# Patient Record
Sex: Female | Born: 1950 | Hispanic: Yes | Smoking: Never smoker
Health system: Southern US, Community
[De-identification: ages and names within clinical notes are randomized; demographics above are authoritative.]

## PROBLEM LIST (undated history)

## (undated) DIAGNOSIS — Z87442 Personal history of urinary calculi: Secondary | ICD-10-CM

## (undated) DIAGNOSIS — N200 Calculus of kidney: Secondary | ICD-10-CM

## (undated) DIAGNOSIS — K219 Gastro-esophageal reflux disease without esophagitis: Secondary | ICD-10-CM

## (undated) HISTORY — DX: Calculus of kidney: N20.0

## (undated) HISTORY — PX: CYST EXCISION: SHX5701

---

## 2005-07-24 ENCOUNTER — Ambulatory Visit: Payer: Self-pay

## 2007-06-05 ENCOUNTER — Emergency Department: Payer: Self-pay | Admitting: Emergency Medicine

## 2008-01-15 ENCOUNTER — Ambulatory Visit: Payer: Self-pay

## 2011-06-13 ENCOUNTER — Ambulatory Visit: Payer: Self-pay

## 2013-07-02 ENCOUNTER — Inpatient Hospital Stay: Payer: Self-pay | Admitting: Internal Medicine

## 2013-07-02 LAB — COMPREHENSIVE METABOLIC PANEL
Albumin: 3.9 g/dL (ref 3.4–5.0)
Alkaline Phosphatase: 122 U/L (ref 50–136)
Anion Gap: 4 — ABNORMAL LOW (ref 7–16)
Bilirubin,Total: 0.2 mg/dL (ref 0.2–1.0)
Calcium, Total: 9.3 mg/dL (ref 8.5–10.1)
Chloride: 107 mmol/L (ref 98–107)
Co2: 28 mmol/L (ref 21–32)
Creatinine: 0.58 mg/dL — ABNORMAL LOW (ref 0.60–1.30)
EGFR (African American): >60
EGFR (Non-African Amer.): >60
Glucose: 108 mg/dL — ABNORMAL HIGH (ref 65–99)
Osmolality: 279 (ref 275–301)
Potassium: 3.6 mmol/L (ref 3.5–5.1)
SGOT(AST): 32 U/L (ref 15–37)
Sodium: 139 mmol/L (ref 136–145)
Total Protein: 7.9 g/dL (ref 6.4–8.2)

## 2013-07-02 LAB — URINALYSIS, COMPLETE
Bilirubin,UR: NEGATIVE
Nitrite: NEGATIVE
Ph: 7 (ref 4.5–8.0)
Protein: NEGATIVE
RBC,UR: <1 /HPF (ref 0–5)
Specific Gravity: 1.01 (ref 1.003–1.030)
Squamous Epithelial: 1
WBC UR: <1 /HPF (ref 0–5)

## 2013-07-02 LAB — CBC
HGB: 13.6 g/dL (ref 12.0–16.0)
MCH: 29.2 pg (ref 26.0–34.0)
Platelet: 190 10*3/uL (ref 150–440)
RBC: 4.68 10*6/uL (ref 3.80–5.20)
RDW: 13.9 % (ref 11.5–14.5)

## 2013-07-02 LAB — TROPONIN I: Troponin-I: <0.02 ng/mL

## 2013-07-03 LAB — LIPID PANEL
HDL Cholesterol: 29 mg/dL — ABNORMAL LOW (ref 40–60)
Triglycerides: 377 mg/dL — ABNORMAL HIGH (ref 0–200)
VLDL Cholesterol, Calc: 75 mg/dL — ABNORMAL HIGH (ref 5–40)

## 2013-07-03 LAB — BASIC METABOLIC PANEL
BUN: 15 mg/dL (ref 7–18)
Calcium, Total: 8.9 mg/dL (ref 8.5–10.1)
Creatinine: 0.65 mg/dL (ref 0.60–1.30)
EGFR (African American): >60
EGFR (Non-African Amer.): >60
Potassium: 3.8 mmol/L (ref 3.5–5.1)

## 2013-10-22 ENCOUNTER — Ambulatory Visit: Payer: Self-pay | Admitting: Family Medicine

## 2014-12-10 NOTE — Discharge Summary (Signed)
PATIENT NAME:  Krystal Lewis, Krystal Lewis MR#:  [id number] DATE OF BIRTH:  1951/08/09  DATE OF ADMISSION:  07/02/2013 DATE OF DISCHARGE:  07/03/2013  DISCHARGE DIAGNOSES: 1.  Transient ischemic attack.  2.  Hypertriglyceridemia.  CONSULTANTS: Dr. Manuella Ghazi with neurology.   IMAGING STUDIES DONE: Include a  1.  CT scan of the head which was normal. 2.  MRI of the brain which showed white matter disease consistent with chronic ischemic changes.  3.  Echocardiogram showed ejection fraction of 65%. No thrombus, no valvular abnormalities.  4.  Ultrasound carotid Doppler showed no significant stenosis.   ADMITTING HISTORY AND PHYSICAL AND HOSPITAL COURSE: Please see detailed H and P dictated by Dr. Posey Pronto. In brief, a 64 year old female patient presented to the hospital with left leg, face numbness and some visual difficulties. The patient was admitted for concern for cerebrovascular accident. The patient had a CT scan of the head done, which was normal. The patient's symptoms had resolved by the time of discharge. MRI of the brain showed only some white matter disease with small vessel ischemia, no acute stroke. The patient was seen by Dr. Manuella Ghazi with neurology, who suggested discharging the patient and follow up as outpatient. The patient has been started on gemfibrozil for her hypertriglyceridemia, along with a baby aspirin every day. The patient is symptom-free, has ambulated well with physical therapy, does not have any rehab needs.   Prior to discharge, the patient's motor strength is 5/5 in upper and lower extremities. Sensation is intact all over.   DISCHARGE MEDICATIONS: Include:  1.  Aspirin 81 mg daily.  2.  Gemfibrozil 600 mg b.i.d.   DISCHARGE INSTRUCTIONS: Low-fat diet. Activity as tolerated. Follow up with Dr. Manuella Ghazi of neurology in 1 to 2 weeks.   TIME SPENT ON DAY OF DISCHARGE IN DISCHARGE ACTIVITY: 45 minutes.    ____________________________ Leia Alf Ceniya Fowers, MD srs:cg D: 07/06/2013 01:49:40  ET T: 07/06/2013 03:01:01 ET JOB#: [id number]  cc: Alveta Heimlich R. Irean Kendricks, MD, <Dictator> Neita Carp MD ELECTRONICALLY SIGNED 07/06/2013 20:48

## 2014-12-10 NOTE — Consult Note (Signed)
PATIENT NAME:  Krystal Lewis, SHIVLEY MR#:  [id number] DATE OF BIRTH:  [birth year]  DATE OF CONSULTATION:  07/03/2013  REFERRING PHYSICIAN:  Dustin Flock, MD, and Hillary Bow, MD CONSULTING PHYSICIAN:  Marlaina Coburn K. Manuella Ghazi, MD  REASON FOR CONSULTATION: Abnormal MRI of the brain with concern for multiple sclerosis.  HISTORY OF PRESENT ILLNESS: Krystal Lewis is a 64 year old female who had an acute onset of numbness and weakness in the left side of her leg. It just felt heavy around November 13 afternoon.  She felt like it was big.   The patient did not have any other difficulties which got better after some time.  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  ALLERGIES: She does not have any known drug allergies.  MEDICATIONS: She does not take any medications on a regular basis.  SOCIAL HISTORY: She does not smoke, does not drink alcohol. Does not do drugs.   FAMILY HISTORY: Significant that her father has a heart valve replaced.   REVIEW OF SYSTEMS: A 10-system review of systems was asked and was found to be negative except a recent history of left leg heaviness and feeling that it was big.   PHYSICAL EXAMINATION:  LUNGS: Clear to auscultation.  HEART: S1, S2, heart sounds. Carotid exam did not reveal any bruit.  ABDOMEN: Soft.  MENTAL STATUS: Alert and oriented, followed 2 step interverted commands. Her attention, concentration, were intact. Her fund of knowledge and language seems to be intact. She does not have any neurological neglect.  NEUROLOGIC: On her cranial nerves, her pupils are equal and nonreactive. Extraocular movements were intact. Her visual fields were full. The patient's sensations were intact. Her hearing was intact. Her facial strength was intact. Her tongue was midline. Her palate was upgoing. Her neck strength and shoulder shrug were 5/5. MOTOR: Normal  tone and strength 5/5.  SENSATIONS: Intact to light touch. Her deep tendon reflexes were 1 to 2+. Her coordination was intact. Her gait was intact. She had a negative Romberg's.  ASSESSMENT AND PLAN:  1.  Transient left leg numbness and weakness which is resolved. 2.  The patient denied any headache associated with it for now. She does not have a known history of migraine. 3.  The patient did have abnormal MRI of the brain showing multiple white matter hyperintensities. A few of them are somewhat periventricular but most of them are subcortical region.  5.  The patient denied any typical history such as history of demyelinating disease such as multiple sclerosis. She denied any optic neuritis or any other previous focal weakness or numbness. She does not have Uhthoff phenomenon or Lhermitte sign. The patient's family does not have any other known autoimmune disease, so I have a low suspicion for this being an autoimmune disease condition such as multiple sclerosis.   The patient should have outpatient followup to look for other etiologies for the white matter disease.  Concerning her vascular risk factors, outpatient should take an aspirin on a regular basis.   It will be okay for this patient to be discharged.  Feel free to contact me with any further questions.   ____________________________ Royetta Crochet. Manuella Ghazi, MD hks:np D: 07/03/2013 16:19:30 ET T: 07/03/2013 17:03:01 ET JOB#: [id number]  cc: Derwin Reddy K. Manuella Ghazi, MD, <Dictator> Royetta Crochet [facility] MD ELECTRONICALLY SIGNED 07/08/2013 11:04

## 2014-12-10 NOTE — H&P (Signed)
PATIENT NAME:  Krystal Lewis, Krystal Lewis MR#:  [id number] DATE OF BIRTH:  04/11/1951  DATE OF ADMISSION:  07/02/2013  PRIMARY CARE PROVIDER: None.   EMERGENCY DEPARTMENT REFERRING PHYSICIAN: Dr. Reita Cliche.   CHIEF COMPLAINT: Left leg weakness, left facial numbness, some visual difficulties.   HISTORY OF PRESENT ILLNESS: The patient is a 64 year old Spanish-speaking female with a history of what sounds like Raynaud's phenomenon of the extremities who has no other medical problems that she is aware, who states that around noontime she developed left leg heaviness and weakness. She states that it felt like her leg was like steel. She was still able to ambulate but had to drag her feet. She also has left lower face numbness. She reports that her vision when she is looking at me I look "big," so I am assuming it is blurred vision. She otherwise reports no similar symptoms like this before. She does not have any difficulty swallowing. No other numbness besides the face. Denies any headaches.   PAST MEDICAL HISTORY: History of what sounds like Raynaud's phenomenon of the extremities.   PAST SURGICAL HISTORY: Left ear surgery a long time ago.   ALLERGIES: None.   HOME MEDICATIONS: None.   SOCIAL HISTORY: Does not smoke. Does not drink. No drugs.   FAMILY HISTORY: Father had a heart valve replaced.   REVIEW OF SYSTEMS:   CONSTITUTIONAL: Denies any fevers. No fatigue. Complains of weakness in the left leg. No pain. No weight loss. No weight gain.  EYES: Sounds like blurred vision. No pain. No redness. No inflammation.  ENT: No tinnitus. No ear pain. No hearing loss. No seasonal or year-round allergies. No epistaxis.  RESPIRATORY: Denies any cough, wheezing, hemoptysis. No COPD.   CARDIOVASCULAR: Denies any chest pain, orthopnea, edema or arrhythmia.  GASTROINTESTINAL: No nausea, vomiting, diarrhea. No abdominal pain. No hematemesis. No melena.  GENITOURINARY: Denies any dysuria, hematuria, renal calculus or  frequency.  ENDOCRINE: Denies any polyuria, nocturia or thyroid problems.  HEMATOLOGIC AND LYMPHATIC: Denies anemia, easy bruisability, bleeding.  SKIN: No acne. No rash. No changes in mole, hair or skin.  MUSCULOSKELETAL: Denies any pain in the neck, back or shoulder.  NEUROLOGIC: Complains of facial numbness. No history of previous CVA, TIA or seizures.  PSYCHIATRIC: No history of anxiety, insomnia or ADD.   PHYSICAL EXAMINATION:  VITAL SIGNS: Temperature 97.9, pulse 70, respirations 18, blood pressure was 192/82 on presentation.  GENERAL: The patient is a well-developed, well-nourished, Spanish female in no acute distress.  HEENT: Head atraumatic, normocephalic. Pupils equally round, reactive to light and accommodation. There is no conjunctival pallor. No scleral icterus. Nasal exam shows no drainage or ulceration. Oropharynx is clear without any exudate.  NECK: Supple without any JVD.  CARDIOVASCULAR: Regular rate and rhythm. No murmurs, rubs, clicks or gallops. PMI is not displaced.  LUNGS: Clear to auscultation bilaterally without any rales, rhonchi, wheezing.  ABDOMEN: Soft, nontender, nondistended. Positive bowel sounds x 4. No hepatosplenomegaly.  EXTREMITIES: No clubbing, cyanosis, or edema.  SKIN: No rash.  LYMPHATICS: No lymph nodes palpable.  VASCULAR: Good DP, PT pulses.  PSYCHIATRIC: Not anxious or depressed.  NEUROLOGIC: She has left lower face diminished sensation. Otherwise, sensation intact everywhere else.   EXTREMITIES: Strength 5 out of 5 in the upper extremities. Left lower extremity strength is diminished. It is 3 to 4 out of 5. Right extremity is 5 out of 5. Reflexes 2+. Babinskis downgoing.   EVALUATIONS: WBC 5.4, hemoglobin 13.6, platelet count 190. Glucose 108, BUN 15, creatinine  0.58, sodium 139, potassium 3.6, chloride 107, CO2 is 28, calcium 9.3.   ASSESSMENT AND PLAN: The patient is a 64 year old Spanish-speaking female, presents with left leg weakness,  facial numbness, some visual blurriness.  1. Left-sided leg weakness, facial numbness, visual difficulties: Likely cerebrovascular accident. CT of the head is negative. Will get an MRI of the brain. Will check carotid Dopplers. The patient will be started on aspirin. Check a fasting lipid panel in the a.m. Will start on atorvastatin. The patient able to swallow. I will ask speech to see and physical therapy to see.  2. Elevated blood pressure: Will allow permissive hypertension in light of likely stroke.  3. Miscellaneous: Will place her on Lovenox for deep vein thrombosis prophylaxis.    TIME SPENT: 55 minutes.   NOTE: Spanish interpreter was used.   ____________________________ Lafonda Mosses Posey Pronto, MD shp:gb D: 07/02/2013 18:19:16 ET T: 07/02/2013 21:23:16 ET JOB#: [id number]  cc: Tyrin Herbers H. Posey Pronto, MD, <Dictator> Alric Seton MD ELECTRONICALLY SIGNED 07/03/2013 16:58

## 2014-12-10 NOTE — Consult Note (Signed)
Brief Consult Note: Diagnosis: transient left leg weakness resolved, abnormal MRI brain.   Patient was seen by consultant.   Consult note dictated.   Comments: - abnormal MRI brain likely reporesents WM microvascular ischemic changes and not MS. - discussed with daughter on the phone. Coniser ASA. - f/up in clinic for further work up for other etiologies of WM FLAIR hyperintensities. - OK to be discharged.  Electronic Signatures: Ray Church (MD)  (Signed [phone] 16:14)  Authored: Brief Consult Note   Last Updated: 14-Nov-14 16:14 by Ray Church (MD)

## 2015-12-14 ENCOUNTER — Emergency Department: Payer: Medicare Other

## 2015-12-14 ENCOUNTER — Encounter: Payer: Self-pay | Admitting: Emergency Medicine

## 2015-12-14 ENCOUNTER — Emergency Department
Admission: EM | Admit: 2015-12-14 | Discharge: 2015-12-14 | Disposition: A | Discharge status: Home / Self Care | Payer: Medicare Other | Attending: Emergency Medicine | Admitting: Emergency Medicine

## 2015-12-14 DIAGNOSIS — F17200 Nicotine dependence, unspecified, uncomplicated: Secondary | ICD-10-CM | POA: Diagnosis not present

## 2015-12-14 DIAGNOSIS — N814 Uterovaginal prolapse, unspecified: Secondary | ICD-10-CM

## 2015-12-14 DIAGNOSIS — N812 Incomplete uterovaginal prolapse: Secondary | ICD-10-CM | POA: Insufficient documentation

## 2015-12-14 DIAGNOSIS — N200 Calculus of kidney: Secondary | ICD-10-CM | POA: Diagnosis not present

## 2015-12-14 DIAGNOSIS — N390 Urinary tract infection, site not specified: Secondary | ICD-10-CM | POA: Insufficient documentation

## 2015-12-14 DIAGNOSIS — R102 Pelvic and perineal pain: Secondary | ICD-10-CM | POA: Diagnosis present

## 2015-12-14 DIAGNOSIS — N132 Hydronephrosis with renal and ureteral calculous obstruction: Secondary | ICD-10-CM | POA: Diagnosis not present

## 2015-12-14 HISTORY — DX: Gastro-esophageal reflux disease without esophagitis: K21.9

## 2015-12-14 LAB — URINALYSIS COMPLETE WITH MICROSCOPIC (ARMC ONLY)
BACTERIA UA: NONE SEEN
BACTERIA UA: NONE SEEN
BILIRUBIN URINE: NEGATIVE
Glucose, UA: NEGATIVE mg/dL
Ketones, ur: NEGATIVE mg/dL
Nitrite: NEGATIVE
PH: 6 (ref 5.0–8.0)
PROTEIN: 100 mg/dL — AB
SQUAMOUS EPITHELIAL / LPF: NONE SEEN
Specific Gravity, Urine: 1.015 (ref 1.005–1.030)
Specific Gravity, Urine: 1.016 (ref 1.005–1.030)

## 2015-12-14 LAB — BASIC METABOLIC PANEL
Anion gap: 6 (ref 5–15)
BUN: 14 mg/dL (ref 6–20)
CHLORIDE: 107 mmol/L (ref 101–111)
CO2: 25 mmol/L (ref 22–32)
CREATININE: 0.61 mg/dL (ref 0.44–1.00)
Calcium: 9.1 mg/dL (ref 8.9–10.3)
GFR calc Af Amer: >60 mL/min (ref >60)
GFR calc non Af Amer: >60 mL/min (ref >60)
Glucose, Bld: 110 mg/dL — ABNORMAL HIGH (ref 65–99)
Potassium: 3.6 mmol/L (ref 3.5–5.1)
SODIUM: 138 mmol/L (ref 135–145)

## 2015-12-14 LAB — CBC
HCT: 40.2 % (ref 35.0–47.0)
HEMOGLOBIN: 13.9 g/dL (ref 12.0–16.0)
MCH: 29.1 pg (ref 26.0–34.0)
MCHC: 34.5 g/dL (ref 32.0–36.0)
MCV: 84.4 fL (ref 80.0–100.0)
Platelets: 173 10*3/uL (ref 150–440)
RBC: 4.77 MIL/uL (ref 3.80–5.20)
RDW: 14 % (ref 11.5–14.5)
WBC: 9.2 10*3/uL (ref 3.6–11.0)

## 2015-12-14 MED ORDER — CIPROFLOXACIN HCL 500 MG PO TABS: 500.0000 mg | ORAL_TABLET | Freq: Two times a day (BID) | ORAL | Status: AC

## 2015-12-14 MED ORDER — CIPROFLOXACIN HCL 500 MG PO TABS
500.0000 mg | ORAL_TABLET | Freq: Once | ORAL | Status: AC
  Administered 2015-12-14: 500 mg via ORAL

## 2015-12-14 NOTE — ED Notes (Signed)
Discharge instructions reviewed with patient. Patient verbalized understanding. Patient ambulated to lobby without difficulty.   

## 2015-12-14 NOTE — Discharge Instructions (Signed)
Clculos renales (Kidney Stones) Los clculos renales (urolitiasis) son masas slidas que se forman en el interior de los riones. El dolor intenso es causado por el movimiento de la piedra a travs del tracto urinario. Cuando la piedra se mueve, el urter hace un espasmo alrededor de la misma. El clculo generalmente se elimina con la [place].  CAUSAS   Un trastorno que hace que ciertas glndulas del cuello produzcan demasiada hormona paratiroidea (hiperparatiroidismo primario).  Una acumulacin de cristales de cido rico, similar a Psychiatric nurse.  Estrechamiento (constriccin) del urter.  Obstruccin en el rin presente al nacer (obstruccin congnita).  Cirugas previas del rin o los urteres.  Numerosas infecciones renales. SNTOMAS   Ganas de vomitar (nuseas).  Devolver la comida (vomitar).  Sangre en la orina (hematuria).  Dolor que generalmente se expande (irradia) hacia la ingle.  Ganas de orinar con frecuencia o de manera urgente. DIAGNSTICO   Historia clnica y examen fsico.  Anlisis de sangre y [place].  Tomografa computada.  En algunos casos se realiza un examen del interior de la vejiga (citoscopa). TRATAMIENTO   Observacin.  Aumentar la ingesta de lquidos.  Litotricia extracorprea con ondas de choque: es un procedimiento no invasivo que [place] ondas de choque para romper los clculos renales.  Ser necesaria la ciruga si tiene dolor muy intenso o la obstruccin persiste. Hay varios procedimientos quirrgicos. La mayora de los procedimientos se realizan con el uso de pequeos instrumentos. Slo es Chartered loss adjuster pequeas incisiones para acomodar estos instrumentos, por lo tanto el tiempo de recuperacin es mnimo. El tamao, la ubicacin y la composicin qumica de los clculos son variables importantes que determinarn la eleccin correcta de tratamiento para su caso. Comunquese con su mdico para comprender mejor su  situacin, de modo que pueda [employer] de lesiones para usted y su rin.  INSTRUCCIONES PARA EL CUIDADO EN EL HOGAR   Beba gran cantidad de lquido para mantener la orina de tono claro o color amarillo plido. Esto ayudar a eliminar las piedras o los fragmentos.  Cuele la orina con el colador que le han provisto. Guarde todas las partculas y piedras para que las vea el profesional que lo asiste. Puede ser tan pequea como un grano de sal. Es muy importante usar el colador cada vez que orine. La recoleccin de piedras permitir al [employer]tronic y Musician que efectivamente ha eliminado una piedra. El anlisis de la piedra con frecuencia permitir identificar qu puede hacer para reducir la incidencia de las recurrencias.  Slo tome medicamentos de venta libre o recetados para Glass blower/designer, Health and safety inspector o bajar la fiebre, segn las indicaciones de su mdico.  Consulting civil engineer a todas las visitas de control como se lo haya indicado el mdico. Esto es importante.  Si se lo indica, hgase radiografas. La ausencia de dolor no siempre significa que las piedras se han eliminado. Puede ser que simplemente hayan dejado de moverse. Si el paso de orina permanece completamente obstruido, puede causar prdida de la funcin renal o simplemente la destruccin del rin. Es su responsabilidad Artist seguimiento y las radiografas. Las ecografas del rin pueden mostrar una obstruccin y el estado del rin. Las ecografas no se asocian con la radiacin y pueden realizarse fcilmente en cuestin de minutos.  Haga cambios en la dieta diaria como se lo haya indicado el mdico. Es posible que le indiquen lo siguiente:  Limitar la cantidad de sal que consume.  Consumir 5 o ms porciones de frutas  y Set designer.  Limitar la cantidad de carne, carne de ave, pescado y [employer] consume.  Recoger Truddie Coco de orina durante 24 horas como se lo haya indicado el mdico. Tal vez tenga que recoger  otra muestra de orina cada 6 o 12 meses. SOLICITE ATENCIN MDICA SI:  Siente dolor que no responde a los Recruitment consultant. SOLICITE ATENCIN MDICA DE INMEDIATO SI:   No puede controlar el dolor con los medicamentos que le han recetado.  Siente escalofros o fiebre.  La gravedad o la intensidad del dolor aumenta durante 18 horas y no se Engineer, production con los analgsicos.  Presenta un nuevo episodio de dolor abdominal.  Sufre mareos o se desmaya.  No puede orinar.   Esta informacin no tiene Marine scientist el consejo del mdico. Asegrese de hacerle al mdico cualquier pregunta que tenga.   Document Released: 08/06/2005 Document Revised: 04/27/2015 Elsevier Interactive Patient Education 2016 [place]  (Urinary Tract Infection)  La infeccin urinaria puede ocurrir en cualquier lugar del tracto urinario. El tracto urinario es un sistema de drenaje del cuerpo por el que se eliminan los desechos y el exceso de [place]. El tracto urinario est formado por dos riones, dos urteres, la vejiga y Geologist, engineering. Los riones son rganos que tienen forma de frijol. Cada rin tiene aproximadamente el tamao del puo. Estn situados debajo de las [place], uno a cada lado de la columna vertebral CAUSAS  La causa de la infeccin son los microbios, que son organismos microscpicos, que incluyen hongos, virus, y bacterias. Estos organismos son tan pequeos que slo pueden verse a travs del microscopio. Las bacterias son los microorganismos que ms comnmente causan infecciones urinarias.  SNTOMAS  Los sntomas pueden variar segn la edad y el sexo del paciente y por la ubicacin de la infeccin. Los sntomas en las mujeres jvenes incluyen la necesidad frecuente e intensa de orinar y una sensacin dolorosa de ardor en la vejiga o en la uretra durante la miccin. Las mujeres y los hombres mayores podrn sentir cansancio, temblores y debilidad y Arts development officer musculares y  Social research officer, government abdominal. Si tiene [place], puede significar que la infeccin est en los riones. Otros sntomas son dolor en la espalda o en los lados debajo de las [place], nuseas y vmitos.  DIAGNSTICO  Para diagnosticar una infeccin urinaria, el mdico le preguntar acerca de sus sntomas. [employer] una [place] de [place]. La muestra de orina se analiza para Hydrographic surveyor bacterias y glbulos blancos de Herbalist. Los glbulos blancos se forman en el organismo para ayudar a Radio broadcast assistant las infecciones.  TRATAMIENTO  Por lo general, las infecciones urinarias pueden tratarse con medicamentos. Debido a que la [employer] de las infecciones son causadas por bacterias, por lo general pueden tratarse con antibiticos. La eleccin del antibitico y la duracin del tratamiento depender de sus sntomas y el tipo de bacteria causante de la infeccin.  INSTRUCCIONES PARA EL CUIDADO EN EL HOGAR   Si le recetaron antibiticos, tmelos exactamente como su mdico le indique. Termine el medicamento aunque se sienta mejor despus de haber tomado slo algunos.  Beba gran cantidad de lquido para mantener la orina de tono claro o color amarillo plido.  Evite la cafena, el t y las bebidas gaseosas. Estas sustancias irritan la vejiga.  Vaciar la vejiga con frecuencia. Evite retener la orina durante largos perodos.  Vace la vejiga antes y despus de Clinical biochemist.  Despus de [place]  mujeres deben higienizarse la regin perineal desde adelante hacia atrs. Use slo un papel tissue por vez. SOLICITE ATENCIN MDICA SI:   Siente dolor en la espalda.  Le sube la fiebre.  Los sntomas no mejoran luego de 3 das. SOLICITE ATENCIN MDICA DE INMEDIATO SI:   Siente dolor intenso en la espalda o en la zona inferior del abdomen.  Comienza a sentir escalofros.  Tiene nuseas o vmitos.  Tiene una sensacin continua de quemazn o molestias al [employer]. ASEGRESE DE QUE:   Comprende  estas instrucciones.  Controlar su enfermedad.  Solicitar ayuda de inmediato si no mejora o empeora.   Esta informacin no tiene Marine scientist el consejo del mdico. Asegrese de hacerle al mdico cualquier pregunta que tenga.   Document Released: 05/16/2005 Document Revised: 04/30/2012 Elsevier Interactive Patient Education 2016 [place] de los rganos de la pelvis (Pelvic Organ Prolapse) El prolapso de los rganos de la pelvis es el estiramiento, la dilatacin o la cada de los rganos de la pelvis a una posicin anormal. Esto sucede cuando los msculos y tejidos que rodean y sostienen las estructuras plvicas se estiran o se debilitan. El prolapso de los rganos de la pelvis puede involucrar los siguientes rganos:  Vagina (prolapso vaginal).  tero (prolapso uterino).  Vejiga (cistocele).  Recto (rectocele).  Intestinos (enterocele). Cuando estn comprometidos rganos que no son la vagina, estos a menudo se protruyen hacia adentro o hacia afuera de la vagina, dependiendo de la gravedad del prolapso. CAUSAS Algunas causas de esta enfermedad incluyen lo siguiente:  [place] y [place].  Tos prolongada (crnica).  Estreimiento crnico.  Obesidad.  Ciruga de pelvis anterior.  Envejecimiento. Durante y despus de la menopausia, una disminucin en la produccin de Therapist, occupational puede debilitar los msculos y los ligamentos plvicos.  Levantar sistemticamente objetos pesados de ms de 50libras (23kg).  Acumulacin de lquido en el abdomen debido a ciertas enfermedades y a Media planner. SNTOMAS Los sntomas de esta afeccin incluyen lo siguiente:  Prdida del control de la vejiga al toser, Brewing technologist, hacer un esfuerzo y Field seismologist ejercicio (incontinencia urinaria de esfuerzo). Esto puede empeorar inmediatamente despus del nacimiento y Teacher, English as a foreign language gradualmente con Physiological scientist.  Sensacin de presin en la pelvis o la vagina. Esta presin  puede aumentar al toser o defecar.  Una protuberancia que sobresale desde la apertura de la vagina o contra la pared vaginal. Si el tero sobresale a travs de la apertura de la vagina y roza la ropa, tambin puede padecer irritacin, lceras, infeccin, dolor y [place].  Mayor esfuerzo para Systems developer.  Dolor en la parte inferior de la espalda.  Dolor, molestias o Midwife.  Infecciones reiteradas en la vejiga (infecciones en las vas urinarias).  Dificultad o incapacidad para colocar un tampn o un aplicador. En algunas personas, esta afeccin no produce sntomas. DIAGNSTICO El mdico puede realizarle un examen interno y externo de la vagina y el recto. Durante el examen, puede pedirle que tosa y que haga un esfuerzo mientras est [place], sentada y de pie. El mdico determinar si se requieren ms estudios, como las pruebas de la funcin de la vejiga. TRATAMIENTO En la [employer], esta afeccin debe tratarse solo si produce sntomas. No existe ningn tratamiento que garantice que el prolapso se corregir o que alivie los sntomas por completo. El tratamiento puede incluir lo siguiente:  Cambios de estilo de vida tales como:  Product/process development scientist las bebidas que contengan cafena.  Aumentar la ingesta de alimentos con alto contenido de [place]. Esto puede ayudar a Transport planner estreimiento y Arboriculturist.  Vaciar la vejiga en momentos programados (terapia de entrenamiento de la vejiga). Esto puede ayudar a reducir o Mining engineer incontinencia urinaria.  Bajar de peso si tiene sobrepeso o es obeso.  Estrgeno. Si el prolapso es leve, el estrgeno puede ayudar al aumentar la fuerza y el tono de los msculos del piso plvico.  Ejercicios de Kegel. Estos ejercicios pueden ayudar en los casos leves de prolapso al estirar y tensar los msculos del piso plvico.  Colocacin de un pesario. Un pesario es un dispositivo blando y flexible que el mdico  coloca en la vagina para ayudar a Nature conservation officer las paredes vaginales y Theatre manager los rganos de la pelvis en su Environmental consultant.  Ciruga. Esta es a menudo la nica forma de tratamiento de los casos graves de prolapso. Existen diferentes tipos de [place] disponibles. INSTRUCCIONES PARA EL CUIDADO EN EL HOGAR  Use una toalla higinica o un producto absorbente si tiene incontinencia urinaria.  Evite levantar objetos pesados y Armed forces training and education officer se ejercita o trabaja. No contenga la respiracin cuando levanta pesas y realiza ejercicios de intensidad leve a moderada. Limite sus actividades segn las indicaciones del [place] los medicamentos solamente como se lo haya indicado el mdico.  Practique los ejercicios de Kegel como se lo haya indicado el mdico.  Si tiene un pesario, selo como se lo haya indicado el mdico. SOLICITE ATENCIN MDICA SI:  Sus sntomas interfieren con sus actividades diarias o su vida sexual.  Necesita medicamentos para Public house manager las [place].  Observa sangrado vaginal no relacionado con su menstruacin.  Tiene fiebre.  Siente dolor o tiene una hemorragia al [employer].  Observa sangrado al defecar.  Pierde orina durante las [employer].  Sufre estreimiento crnico.  Tiene un pesario colocado y Radio broadcast assistant.  Tiene secrecin vaginal con olor ftido.  Tiene dolor o clicos inusuales en la parte inferior del abdomen.   Esta informacin no tiene Marine scientist el consejo del mdico. Asegrese de hacerle al mdico cualquier pregunta que tenga.   Document Released: 03/03/2014 Elsevier Interactive Patient Education [employer].

## 2015-12-14 NOTE — ED Provider Notes (Signed)
[facility] Emergency Department Provider Note  ____________________________________________  Time seen: 2:00 AM  I have reviewed the triage vital signs and the nursing notes.   HISTORY  Chief Complaint Vaginal Pain and Hematuria      HPI Krystal Lewis is a 65 y.o. female presents with acute onset of vaginal pressure and hematuria tonight. Patient states feels like him that I have a baby". Patient states that she noted blood in her urine tonight. Patient admits to urinary frequency however denies any dysuria or fever. Patient denies any back or flank pain.     Past Medical History  Diagnosis Date  . Acid reflux     There are no active problems to display for this patient.   Past surgical history None No current outpatient prescriptions on file.  Allergies No known drug allergies No family history on file.  Social History Social History  Substance Use Topics  . Smoking status: Current Every Day Smoker  . Smokeless tobacco: None  . Alcohol Use: No    Review of Systems  Constitutional: Negative for fever. Eyes: Negative for visual changes. ENT: Negative for sore throat. Cardiovascular: Negative for chest pain. Respiratory: Negative for shortness of breath. Gastrointestinal: Negative for abdominal pain, vomiting and diarrhea. Genitourinary: Negative for dysuria.Positive for urinary frequency hematuria and vaginal pressure Musculoskeletal: Negative for back pain. Skin: Negative for rash. Neurological: Negative for headaches, focal weakness or numbness.   10-point ROS otherwise negative.  ____________________________________________   PHYSICAL EXAM:  VITAL SIGNS: ED Triage Vitals  Enc Vitals Group     BP 12/14/15 0122 168/80 mmHg     Pulse Rate 12/14/15 0122 81     Resp 12/14/15 0122 18     Temp 12/14/15 0122 98 F (36.7 C)     Temp Source 12/14/15 0122 Oral     SpO2 12/14/15 0122 98 %     Weight 12/14/15 0122 140 lb  (63.504 kg)     Height 12/14/15 0122 5\' 2"  (1.575 m)     Head Cir --      Peak Flow --      Pain Score 12/14/15 0124 2     Pain Loc --      Pain Edu? --      Excl. in [place]? --     Constitutional: Alert and oriented. Well appearing and in no distress. Eyes: Conjunctivae are normal. PERRL. Normal extraocular movements. ENT   Head: Normocephalic and atraumatic.   Nose: No congestion/rhinnorhea.   Mouth/Throat: Mucous membranes are moist.   Neck: No stridor. Hematological/Lymphatic/Immunilogical: No cervical lymphadenopathy. Cardiovascular: Normal rate, regular rhythm. Normal and symmetric distal pulses are present in all extremities. No murmurs, rubs, or gallops. Respiratory: Normal respiratory effort without tachypnea nor retractions. Breath sounds are clear and equal bilaterally. No wheezes/rales/rhonchi. Gastrointestinal: Soft and nontender. No distention. There is no CVA tenderness. Genitourinary: Grade 1 uterine prolapse noted on vaginal exam Musculoskeletal: Nontender with normal range of motion in all extremities. No joint effusions.  No lower extremity tenderness nor edema. Neurologic:  Normal speech and language. No gross focal neurologic deficits are appreciated. Speech is normal.  Skin:  Skin is warm, dry and intact. No rash noted. Psychiatric: Mood and affect are normal. Speech and behavior are normal. Patient exhibits appropriate insight and judgment.  ____________________________________________    LABS (pertinent positives/negatives)  Labs Reviewed  URINALYSIS COMPLETEWITH MICROSCOPIC ([facility] ONLY) - Abnormal; Notable for the following:    Color, Urine RED (*)    APPearance CLOUDY (*)  Glucose, UA   (*)    Value: TEST NOT REPORTED DUE TO COLOR INTERFERENCE OF URINE PIGMENT   Bilirubin Urine   (*)    Value: TEST NOT REPORTED DUE TO COLOR INTERFERENCE OF URINE PIGMENT   Ketones, ur   (*)    Value: TEST NOT REPORTED DUE TO COLOR INTERFERENCE OF URINE  PIGMENT   Hgb urine dipstick   (*)    Value: TEST NOT REPORTED DUE TO COLOR INTERFERENCE OF URINE PIGMENT   Protein, ur   (*)    Value: TEST NOT REPORTED DUE TO COLOR INTERFERENCE OF URINE PIGMENT   Nitrite   (*)    Value: TEST NOT REPORTED DUE TO COLOR INTERFERENCE OF URINE PIGMENT   Leukocytes, UA   (*)    Value: TEST NOT REPORTED DUE TO COLOR INTERFERENCE OF URINE PIGMENT   All other components within normal limits  BASIC METABOLIC PANEL - Abnormal; Notable for the following:    Glucose, Bld 110 (*)    All other components within normal limits  URINALYSIS COMPLETEWITH MICROSCOPIC ([facility] ONLY) - Abnormal; Notable for the following:    Color, Urine AMBER (*)    APPearance CLOUDY (*)    Hgb urine dipstick 3+ (*)    Protein, ur 100 (*)    Leukocytes, UA 3+ (*)    Squamous Epithelial / LPF 0-5 (*)    All other components within normal limits  URINE CULTURE  CBC      RADIOLOGY CT Renal Stone Study (Final result) Result time: 12/14/15 05:51:42   Final result by Rad Results In Interface (12/14/15 05:51:42)   Narrative:   CLINICAL DATA: Hematuria and pelvic discomfort  EXAM: CT ABDOMEN AND PELVIS WITHOUT CONTRAST  TECHNIQUE: Multidetector CT imaging of the abdomen and pelvis was performed following the standard protocol without IV contrast.  COMPARISON: None.  FINDINGS: Lower chest and abdominal wall: Small fatty umbilical hernia.  Varicosities over the lower pelvis with treatment changes in the right groin.  Coronary atherosclerosis seen in the right circulation.  Hepatobiliary: No focal liver abnormality.No evidence of biliary obstruction or stone.  Pancreas: Unremarkable.  Spleen: Unremarkable.  Adrenals/Urinary Tract: Negative adrenals. Mild bilateral hydronephrosis with rapid tapering to normal size ureters. No obstructive process is seen. A punctate stone is present near the upper urethra  Reproductive:No pathologic findings.  Stomach/Bowel: No  obstruction. Mild colonic diverticulosis. Negative appendix  Vascular/Lymphatic: Atherosclerosis. No acute vascular finding. No mass or adenopathy.  Peritoneal: No ascites or pneumoperitoneum.  Musculoskeletal: Spondylosis with particularly bulky spurring at the lower thoracic levels. Lower lumbar facet arthropathy with grade 1 anterolisthesis at L4-5. Sclerosis and ankylosis of the sacroiliac joints appears degenerative given the degree of spurring.  IMPRESSION: 1. 2 mm calculus at the lower bladder, potentially recently passed given the history. 2. Mild bilateral hydronephrosis without obstructive process. Question mild congenital UPJ obstruction.   Electronically Signed By: Monte Fantasia M.D. On: 12/14/2015 05:51        ECG Results        INITIAL IMPRESSION / ASSESSMENT AND PLAN / ED COURSE  Pertinent labs & imaging results that were available during my care of the patient were reviewed by me and considered in my medical decision making (see chart for details).  Ciprofloxacin given urinary tract infection kidney stone noted in the base of the bladder. Uterine prolapse prostate grade 1 noted on vaginal exam. Patient will be referred to Dr. Marcelline Mates OB/GYN for further evaluation for her uterine prolapse. Patient prescribe ciprofloxacin  for urinary tract infection at home.  ____________________________________________   FINAL CLINICAL IMPRESSION(S) / ED DIAGNOSES  Final diagnoses:  Kidney stone  UTI (lower urinary tract infection)  Uterine prolapse      Gregor Hams, MD 12/14/15 [phone]

## 2015-12-14 NOTE — ED Notes (Signed)
Patient ambulatory to triage with steady gait, without difficulty or distress noted; pt reports vag pressure and hematuria tonight; denies abd or back pain; denies fever

## 2015-12-15 LAB — URINE CULTURE: Culture: NO GROWTH

## 2015-12-20 ENCOUNTER — Ambulatory Visit (INDEPENDENT_AMBULATORY_CARE_PROVIDER_SITE_OTHER): Payer: Medicare Other | Admitting: Obstetrics and Gynecology

## 2015-12-20 ENCOUNTER — Encounter: Payer: Self-pay | Admitting: Obstetrics and Gynecology

## 2015-12-20 VITALS — BP 134/73 | HR 78 | Ht 62.0 in | Wt 140.1 lb

## 2015-12-20 DIAGNOSIS — IMO0002 Reserved for concepts with insufficient information to code with codable children: Secondary | ICD-10-CM

## 2015-12-20 DIAGNOSIS — N952 Postmenopausal atrophic vaginitis: Secondary | ICD-10-CM | POA: Diagnosis not present

## 2015-12-20 DIAGNOSIS — N816 Rectocele: Secondary | ICD-10-CM

## 2015-12-20 DIAGNOSIS — N8110 Cystocele, unspecified: Secondary | ICD-10-CM | POA: Diagnosis not present

## 2015-12-20 DIAGNOSIS — N950 Postmenopausal bleeding: Secondary | ICD-10-CM

## 2015-12-20 DIAGNOSIS — N9489 Other specified conditions associated with female genital organs and menstrual cycle: Secondary | ICD-10-CM

## 2015-12-20 DIAGNOSIS — R102 Pelvic and perineal pain: Secondary | ICD-10-CM | POA: Insufficient documentation

## 2015-12-20 NOTE — Patient Instructions (Signed)
1. Pelvic ultrasound is ordered to assess postmenopausal bleeding 2. Return in 1 week after ultrasound for possible endometrial biopsy 3. If all findings are normal, we will likely recommend vaginal estrogen therapy to help with some pelvic pressure symptoms  About Cystocele  Overview  The pelvic organs, including the bladder, are normally supported by pelvic floor muscles and ligaments.  When these muscles and ligaments are stretched, weakened or torn, the wall between the bladder and the vagina sags or herniates causing a prolapse, sometimes called a cystocele.  This condition may cause discomfort and problems with emptying the bladder.  It can be present in various stages.  Some people are not aware of the changes.  Others may notice changes at the vaginal opening or a feeling of the bladder dropping outside the body.  Causes of a Cystocele  A cystocele is usually caused by muscle straining or stretching during childbirth.  In addition, cystocele is more common after menopause, because the hormone estrogen helps keep the elastic tissues around the pelvic organs strong.  A cystocele is more likely to occur when levels of estrogen decrease.  Other causes include: heavy lifting, chronic coughing, previous pelvic surgery and obesity.  Symptoms  A bladder that has dropped from its normal position may cause: unwanted urine leakage (stress incontinence), frequent urination or urge to urinate, incomplete emptying of the bladder (not feeling bladder relief after emptying), pain or discomfort in the vagina, pelvis, groin, lower back or lower abdomen and frequent urinary tract infections.  Mild cases may not cause any symptoms.  Treatment Options  Pelvic floor (Kegel) exercises:  Strength training the muscles in your genital area  Behavioral changes: Treating and preventing constipation, taking time to empty your bladder properly, learning to lift properly and/or avoid heavy lifting when possible,  stopping smoking, avoiding weight gain and treating a chronic cough or bronchitis.  A pessary: A vaginal support device is sometimes used to help pelvic support caused by muscle and ligament changes.  Surgery: Surgical repair may be necessary if symptoms cannot be managed with exercise, behavioral changes and a pessary.  Surgery is usually considered for severe cases.   2007, Progressive TherapeuticsHemorragia postmenopusica (Postmenopausal Bleeding) El sangrado postmenopusico es el sangrado que tiene una mujer despus de haber entrado en la menopausia. La menopausia es el final de la edad frtil de la [place]. Despus de la menopausia, una mujer deja de ovular y de tener perodos [place].  La hemorragia postmenopusica puede tener varias causas. Cualquier tipo de hemorragia postmenopusica, incluso si parece ser un perodo menstrual tpico, es preocupante. Esto lo evaluar el mdico. Cualquier tratamiento depender de la causa del sangrado. INSTRUCCIONES PARA EL CUIDADO EN EL HOGAR Controle su afeccin para ver si hay cambios. Las siguientes indicaciones ayudarn a Public house manager cualquier molestia que pueda sentir: 5. Evite las duchas vaginales y el uso de tampones segn lo que le indique su mdico. 6. Cmbiese las compresas con frecuencia. 7. Hgase exmenes plvicos regulares y pruebas de [place]apan[place]. 8. Cumpla con todas las visitas de control, segn le indique su mdico. SOLICITE ATENCIN MDICA SI:   El sangrado dura ms de 1 semana.  Siente dolor abdominal.  Tiene hemorragias [place]. SOLICITE ATENCIN MDICA DE INMEDIATO SI:   Usted tiene fiebre, escalofros, mareos, dolor de cabeza, dolores musculares y Pensions consultant.  Tiene dolor con el sangrado.  Elimina cogulos de [place].  Tiene sangrado y necesita ms de un apsito por hora.  Siente que va a desmayarse. ASEGRESE DE  QUE:  Comprende estas instrucciones.  Controlar su afeccin.  Recibir  ayuda de inmediato si no mejora o si empeora.   Esta informacin no tiene Marine scientist el consejo del mdico. Asegrese de hacerle al mdico cualquier pregunta que tenga.   Document Released: 01/23/2008 Document Revised: 05/27/2013 Elsevier Interactive Patient Education [employer].

## 2015-12-20 NOTE — Progress Notes (Signed)
GYN ENCOUNTER NOTE  Subjective:       Krystal Lewis is a 65 y.o. [id number] female is here for gynecologic evaluation of the following issues:  1. Postmenopausal bleeding which occurred 1 wk ago. Patient expressed that bleeding was constant for about 2 hours and heavy. Denied any pain with bleeding, vaginal pain, or other vaginal discharge. Went to ER for tx, they found a kidney stone and tx for UTI with cipro. Patient denies any dysuria or abdominal pain.  2. Pelvic pressure which patient feels more with standing. Denies any actual protrusion of organs. No increased frequency or urgency of urination, no SUI, no constipation or issues with defecation.    Gynecologic History No LMP recorded (approximate). Patient is postmenopausal. Contraception: post menopausal status   Obstetric History OB History  Gravida Para Term Preterm AB SAB TAB Ectopic Multiple Living  4 4 4       4     # Outcome Date GA Lbr Len/2nd Weight Sex Delivery Anes PTL Lv  4 Term 80    F Vag-Spont   Y  3 Term 1974    M Vag-Spont   Y  2 Term 1970    F Vag-Spont   Y  1 Term 1968    M Vag-Spont   Y      Past Medical History  Diagnosis Date  . Acid reflux   . Kidney stone     Past Surgical History  Procedure Laterality Date  . Cyst excision      from [place] x 2    Current Outpatient Prescriptions on File Prior to Visit  Medication Sig Dispense Refill  . ciprofloxacin (CIPRO) 500 MG tablet Take 1 tablet (500 mg total) by mouth 2 (two) times daily. 14 tablet 0   No current facility-administered medications on file prior to visit.    No Known Allergies  Social History   Social History  . Marital Status: Divorced    Spouse Name: N/A  . Number of Children: N/A  . Years of Education: N/A   Occupational History  . Not on file.   Social History Main Topics  . Smoking status: Never Smoker   . Smokeless tobacco: Not on file  . Alcohol Use: No  . Drug Use: No  . Sexual Activity: Not Currently    Birth  Control/ Protection: Post-menopausal   Other Topics Concern  . Not on file   Social History Narrative    Family History  Problem Relation Age of Onset  . Leukemia Mother   . Diabetes Mother   . Prostate cancer Father   . Leukemia Maternal Aunt   . Leukemia Maternal Grandmother   . Asthma Neg Hx   . Ovarian cancer Neg Hx   . Colon cancer Neg Hx     The following portions of the patient's history were reviewed and updated as appropriate: allergies, current medications, past family history, past medical history, past social history, past surgical history and problem list.  Review of Systems Review of Systems - Negative except as mentioned in HPI Review of Systems - General ROS: negative for - chills, fatigue, fever, hot flashes, malaise or night sweats Hematological and Lymphatic ROS: negative for - bleeding problems or swollen lymph nodes Gastrointestinal ROS: negative for - abdominal pain, blood in stools, change in bowel habits and nausea/vomiting Musculoskeletal ROS: negative for - joint pain, muscle pain or muscular weakness Genito-Urinary ROS: negative for -  dyspareunia, dysuria, genital discharge, genital ulcers, incontinence, nocturia or pelvic  pain  Objective:   BP 134/73 mmHg  Pulse 78  Ht 5\' 2"  (1.575 m)  Wt 140 lb 1.6 oz (63.549 kg)  BMI 25.62 kg/m2  LMP  (Approximate) CONSTITUTIONAL: Well-developed, well-nourished female in no acute distress.  HENT:  Normocephalic, atraumatic.  NECK: Normal range of motion, supple, no masses.  Normal thyroid.  SKIN: Skin is warm and dry. No rash noted. Not diaphoretic. No erythema. No pallor. [place]: Alert and oriented to person, place, and time.  PSYCHIATRIC: Normal mood and affect. Normal behavior. Normal judgment and thought content. CARDIOVASCULAR:Not Examined RESPIRATORY: Not Examined BREASTS: Not Examined ABDOMEN: Soft, non distended; Non tender.  No Organomegaly. PELVIC:  External Genitalia: Normal  BUS:  Normal  Vagina: mild atrophy  Cervix: Normal  Uterus: Normal size, shape,consistency, mobile; no specific prolapse  Adnexa: Normal  RV: mild rectocele  Bladder: Nontender, grade one cystocele MUSCULOSKELETAL: Normal range of motion. No tenderness.  No cyanosis, clubbing, or edema.     Assessment:   1. Postmenopausal bleeding - US Pelvis Complete; Future - US Transvaginal Non-OB; Future  2. Vaginal atrophy  3. Cystocele - grade 1 with no current sxs  4. Rectocele - mild with no current symptoms  5. Pelvic pressure in female      Plan:   1. Transvaginal US scheduled and will f/u 1 week after. If US shows thickened endometrial lining > 4 mm, discussed endometrial biopsy.  2. Discussed that vaginal atrophy may be cause for decreased pelvic support. Discussed possibility of adding vaginal estrogen cream 2x weekly to improve pelvic pressure  3. Patient not currently symptomatic from cystocele or rectocele, but discussed pessary as future option.  4. Follow-up 1 week after [place]   Kelly Rayburn PA-S Brayton Mars, MD   I have seen, interviewed, and examined the patient in conjunction with the [facility].A. student and affirm the diagnosis and management plan. Ruby Dilone A. Serenity Fortner, MD, FACOG   Note: This dictation was prepared with Dragon dictation along with smaller phrase technology. Any transcriptional errors that result from this process are unintentional.

## 2015-12-23 ENCOUNTER — Ambulatory Visit: Payer: Medicare Other

## 2015-12-27 ENCOUNTER — Ambulatory Visit (INDEPENDENT_AMBULATORY_CARE_PROVIDER_SITE_OTHER): Payer: Medicare Other

## 2015-12-27 DIAGNOSIS — N950 Postmenopausal bleeding: Secondary | ICD-10-CM

## 2016-01-10 ENCOUNTER — Ambulatory Visit (INDEPENDENT_AMBULATORY_CARE_PROVIDER_SITE_OTHER): Payer: Medicare Other | Admitting: Obstetrics and Gynecology

## 2016-01-10 ENCOUNTER — Encounter: Payer: Self-pay | Admitting: Obstetrics and Gynecology

## 2016-01-10 VITALS — BP 145/64 | HR 90 | Ht 62.0 in | Wt 141.8 lb

## 2016-01-10 DIAGNOSIS — D259 Leiomyoma of uterus, unspecified: Secondary | ICD-10-CM

## 2016-01-10 DIAGNOSIS — N8110 Cystocele, unspecified: Secondary | ICD-10-CM

## 2016-01-10 DIAGNOSIS — N950 Postmenopausal bleeding: Secondary | ICD-10-CM | POA: Diagnosis not present

## 2016-01-10 DIAGNOSIS — N9489 Other specified conditions associated with female genital organs and menstrual cycle: Secondary | ICD-10-CM

## 2016-01-10 DIAGNOSIS — N816 Rectocele: Secondary | ICD-10-CM

## 2016-01-10 DIAGNOSIS — R102 Pelvic and perineal pain: Secondary | ICD-10-CM

## 2016-01-10 DIAGNOSIS — IMO0002 Reserved for concepts with insufficient information to code with codable children: Secondary | ICD-10-CM

## 2016-01-10 NOTE — Patient Instructions (Signed)
1. Ultrasound showed several small fibroids. The endometrial lining stripe is thin measuring 2.9 mm. NO endometrial biopsy is needed at this time. 2. Monitor for any further abnormal bleeding; contact us if this bleeding recurs 3. Follow-up as needed if symptoms of cystocele or rectocele develop.

## 2016-01-10 NOTE — Progress Notes (Signed)
Chief complaint: 1. Postmenopausal bleeding 2. Pelvic pressure 3. Cystocele 4. Rectocele 5. Vaginal atrophy  Patient presents for follow-up after ultrasound for evaluation of postmenopausal bleeding. Pelvic ultrasound demonstrates several small uterine fibroids. Endometrial stripe is normal at 2.9 mm. Endometrial biopsy is not necessary at this time.  Patient has not had any further postmenopausal bleeding.  Past medical history, past surgical history, problem list, medications, and allergies are reviewed  OBJECTIVE: BP 145/64 mmHg  Pulse 90  Ht 5\' 2"  (1.575 m)  Wt 141 lb 12.5 oz (64.311 kg)  BMI 25.93 kg/m2  LMP  (Approximate) Physical exam-deferred  ASSESSMENT: 1. Ultrasound-uterine fibroids; normal thin endometrial stripe 2. No further postmenopausal bleeding 3. No need for endometrial biopsy at this time 4. Cystocele/rectocele, asymptomatic 5. Pelvic pressure, chronic  PLAN: 1. Patient is to notify us if she has any further postmenopausal bleeding. 2. Patient is to return on an as-needed basis if she develops symptoms of her cystocele/rectocele.  A total of 15 minutes were spent face-to-face with the patient during this encounter and over half of that time dealt with counseling and coordination of care.  Brayton Mars, MD  Note: This dictation was prepared with Dragon dictation along with smaller phrase technology. Any transcriptional errors that result from this process are unintentional.

## 2016-08-16 DIAGNOSIS — L03019 Cellulitis of unspecified finger: Secondary | ICD-10-CM | POA: Diagnosis not present

## 2016-08-17 DIAGNOSIS — L03019 Cellulitis of unspecified finger: Secondary | ICD-10-CM | POA: Diagnosis not present

## 2016-08-31 DIAGNOSIS — L03019 Cellulitis of unspecified finger: Secondary | ICD-10-CM | POA: Diagnosis not present

## 2016-09-19 DIAGNOSIS — L03019 Cellulitis of unspecified finger: Secondary | ICD-10-CM | POA: Diagnosis not present

## 2016-09-20 ENCOUNTER — Ambulatory Visit
Admission: RE | Admit: 2016-09-20 | Discharge: 2016-09-20 | Disposition: A | Discharge status: Home / Self Care | Payer: Medicare Other | Source: Ambulatory Visit | Attending: Family Medicine | Admitting: Family Medicine

## 2016-09-20 ENCOUNTER — Other Ambulatory Visit: Payer: Self-pay | Admitting: Family Medicine

## 2016-09-20 DIAGNOSIS — L03012 Cellulitis of left finger: Secondary | ICD-10-CM | POA: Insufficient documentation

## 2016-09-20 DIAGNOSIS — R52 Pain, unspecified: Secondary | ICD-10-CM

## 2016-09-20 DIAGNOSIS — R937 Abnormal findings on diagnostic imaging of other parts of musculoskeletal system: Secondary | ICD-10-CM | POA: Diagnosis not present

## 2016-09-20 DIAGNOSIS — M79645 Pain in left finger(s): Secondary | ICD-10-CM | POA: Diagnosis not present

## 2016-09-24 DIAGNOSIS — E785 Hyperlipidemia, unspecified: Secondary | ICD-10-CM | POA: Diagnosis not present

## 2016-09-24 DIAGNOSIS — I10 Essential (primary) hypertension: Secondary | ICD-10-CM | POA: Diagnosis not present

## 2016-09-24 DIAGNOSIS — L928 Other granulomatous disorders of the skin and subcutaneous tissue: Secondary | ICD-10-CM | POA: Diagnosis not present

## 2016-09-24 DIAGNOSIS — L03012 Cellulitis of left finger: Secondary | ICD-10-CM | POA: Diagnosis not present

## 2016-09-25 DIAGNOSIS — I10 Essential (primary) hypertension: Secondary | ICD-10-CM | POA: Diagnosis not present

## 2016-09-25 DIAGNOSIS — M60242 Foreign body granuloma of soft tissue, not elsewhere classified, left hand: Secondary | ICD-10-CM | POA: Diagnosis not present

## 2016-09-25 DIAGNOSIS — R739 Hyperglycemia, unspecified: Secondary | ICD-10-CM | POA: Diagnosis not present

## 2016-09-25 DIAGNOSIS — L03012 Cellulitis of left finger: Secondary | ICD-10-CM | POA: Diagnosis not present

## 2016-09-25 DIAGNOSIS — L03019 Cellulitis of unspecified finger: Secondary | ICD-10-CM | POA: Diagnosis not present

## 2016-09-25 DIAGNOSIS — E785 Hyperlipidemia, unspecified: Secondary | ICD-10-CM | POA: Diagnosis not present

## 2016-09-28 DIAGNOSIS — I10 Essential (primary) hypertension: Secondary | ICD-10-CM | POA: Diagnosis not present

## 2016-09-28 DIAGNOSIS — E785 Hyperlipidemia, unspecified: Secondary | ICD-10-CM | POA: Diagnosis not present

## 2016-09-28 DIAGNOSIS — R7303 Prediabetes: Secondary | ICD-10-CM | POA: Diagnosis not present

## 2016-09-28 DIAGNOSIS — L03012 Cellulitis of left finger: Secondary | ICD-10-CM | POA: Diagnosis not present

## 2016-10-08 DIAGNOSIS — L03012 Cellulitis of left finger: Secondary | ICD-10-CM | POA: Diagnosis not present

## 2016-10-08 DIAGNOSIS — Z1231 Encounter for screening mammogram for malignant neoplasm of breast: Secondary | ICD-10-CM | POA: Diagnosis not present

## 2016-10-08 DIAGNOSIS — I10 Essential (primary) hypertension: Secondary | ICD-10-CM | POA: Diagnosis not present

## 2016-11-09 DIAGNOSIS — Z1231 Encounter for screening mammogram for malignant neoplasm of breast: Secondary | ICD-10-CM | POA: Diagnosis not present

## 2016-11-09 DIAGNOSIS — H409 Unspecified glaucoma: Secondary | ICD-10-CM | POA: Diagnosis not present

## 2016-11-26 DIAGNOSIS — H40003 Preglaucoma, unspecified, bilateral: Secondary | ICD-10-CM | POA: Diagnosis not present

## 2016-11-26 DIAGNOSIS — H04123 Dry eye syndrome of bilateral lacrimal glands: Secondary | ICD-10-CM | POA: Diagnosis not present

## 2016-11-26 DIAGNOSIS — H2513 Age-related nuclear cataract, bilateral: Secondary | ICD-10-CM | POA: Diagnosis not present

## 2016-12-04 DIAGNOSIS — H2511 Age-related nuclear cataract, right eye: Secondary | ICD-10-CM | POA: Diagnosis not present

## 2016-12-04 DIAGNOSIS — H278 Other specified disorders of lens: Secondary | ICD-10-CM | POA: Diagnosis not present

## 2016-12-04 DIAGNOSIS — H52201 Unspecified astigmatism, right eye: Secondary | ICD-10-CM | POA: Diagnosis not present

## 2017-01-02 DIAGNOSIS — Z1231 Encounter for screening mammogram for malignant neoplasm of breast: Secondary | ICD-10-CM | POA: Diagnosis not present

## 2018-01-29 IMAGING — CT CT RENAL STONE PROTOCOL
3 of 4 series · 10 of 46 positions shown, 15 images · non-contrast
Comparison: None.

CLINICAL DATA: Hematuria and pelvic discomfort

EXAM:
CT ABDOMEN AND PELVIS WITHOUT CONTRAST
TECHNIQUE: Multidetector CT imaging of the abdomen and pelvis was performed
following the standard protocol without IV contrast.

[Series 4: lung · axial · 0.72mm/px · z∈[-202,-97]mm · 6 of 31 slices shown, 11 images]
[im 5/31  soft-tissue]
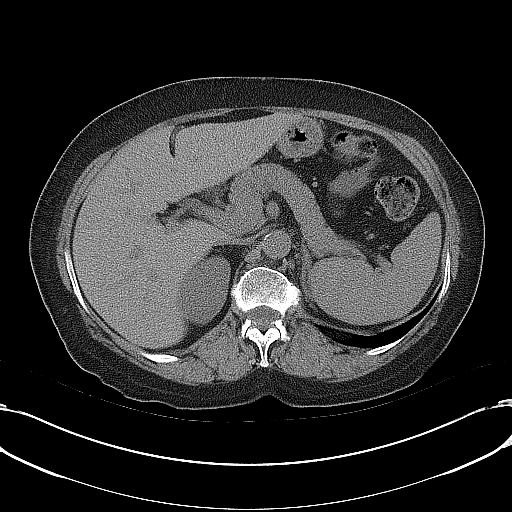
[im 5/31  bone]
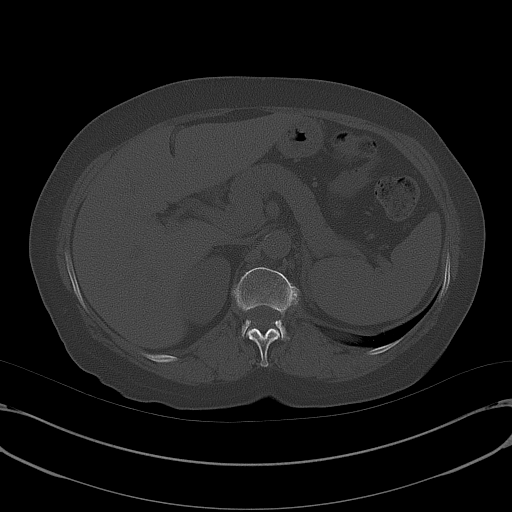
[im 9/31  soft-tissue]
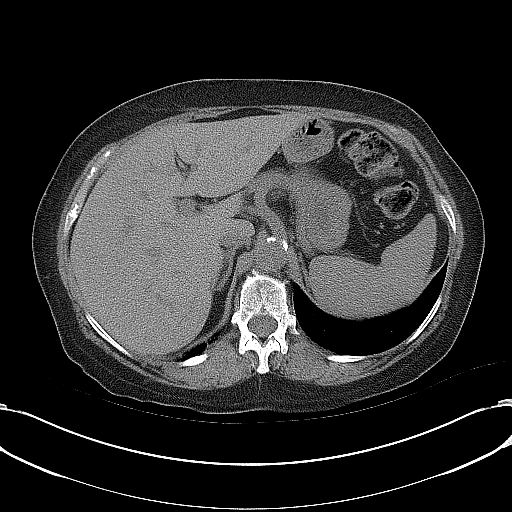
[im 13/31  soft-tissue]
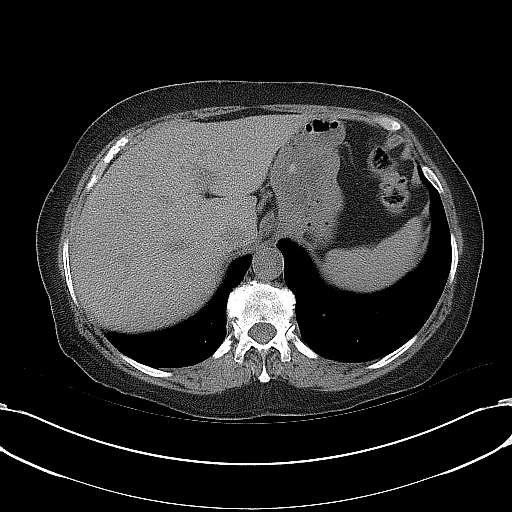
[im 13/31  lung]
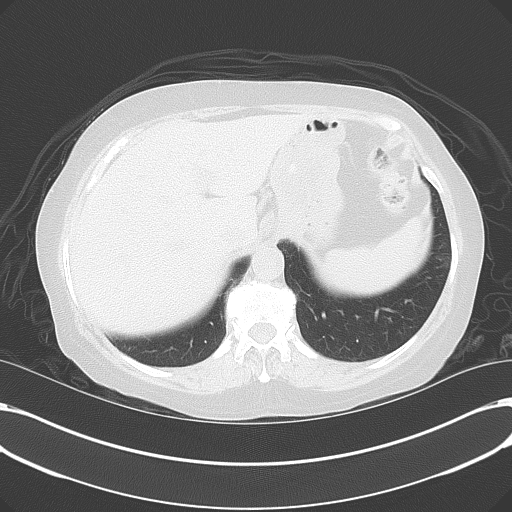
[im 18/31  soft-tissue]
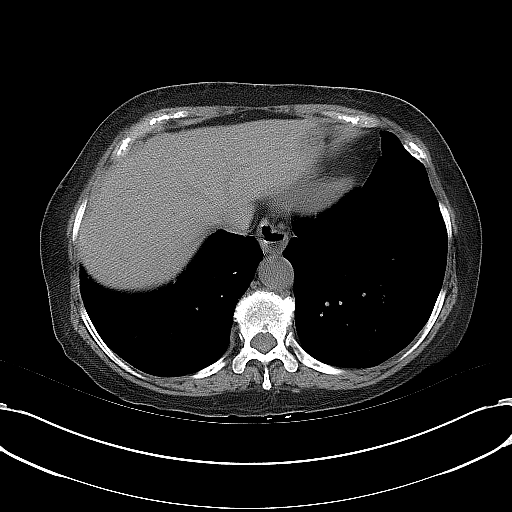
[im 18/31  lung]
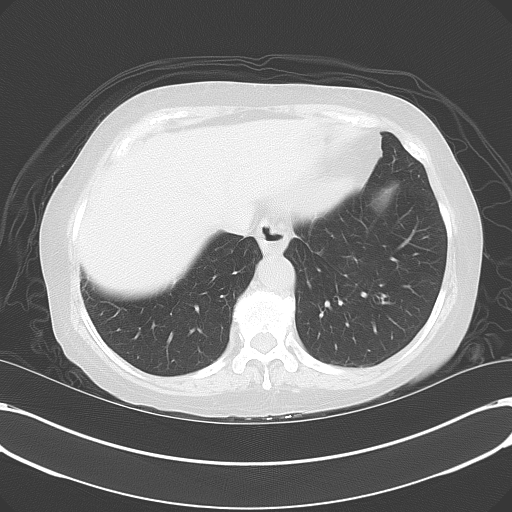
[im 22/31  soft-tissue]
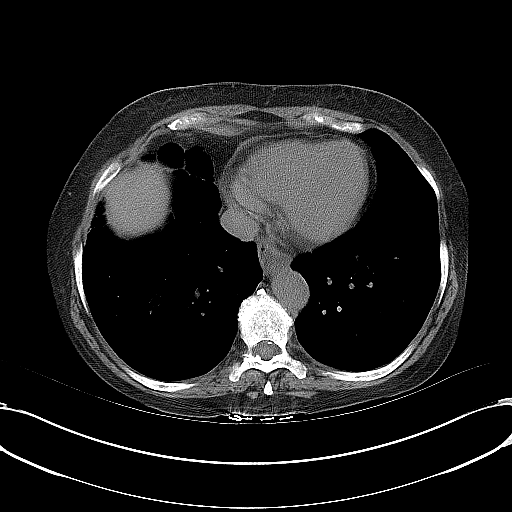
[im 22/31  lung]
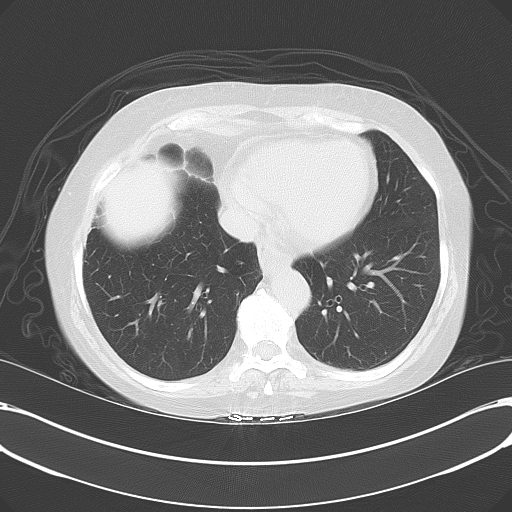
[im 26/31  soft-tissue]
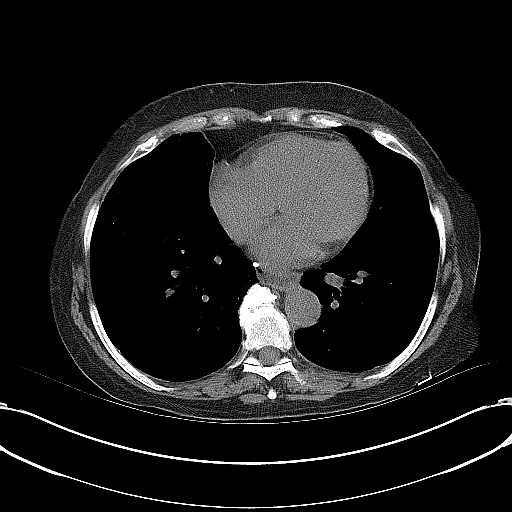
[im 26/31  lung]
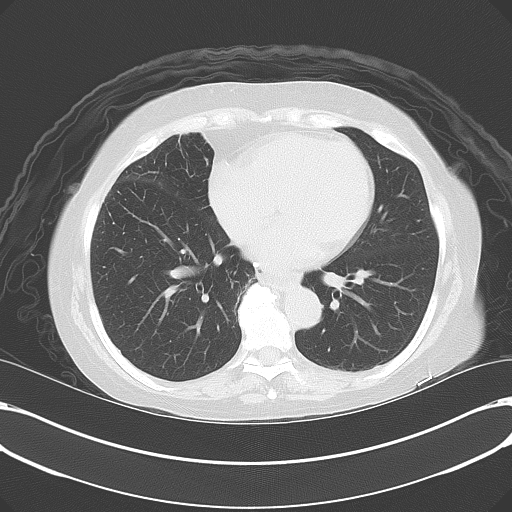

[Series 5: coronal · coronal · 0.70mm/px · 3 of 123 slices shown]
[im 41/123  soft-tissue]
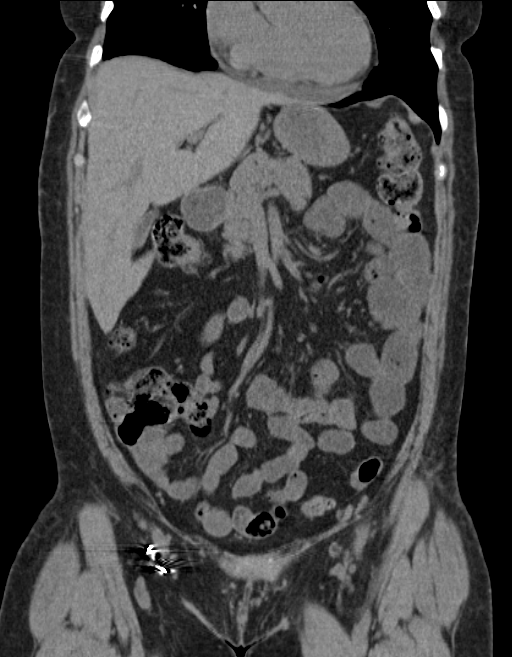
[im 55/123  soft-tissue]
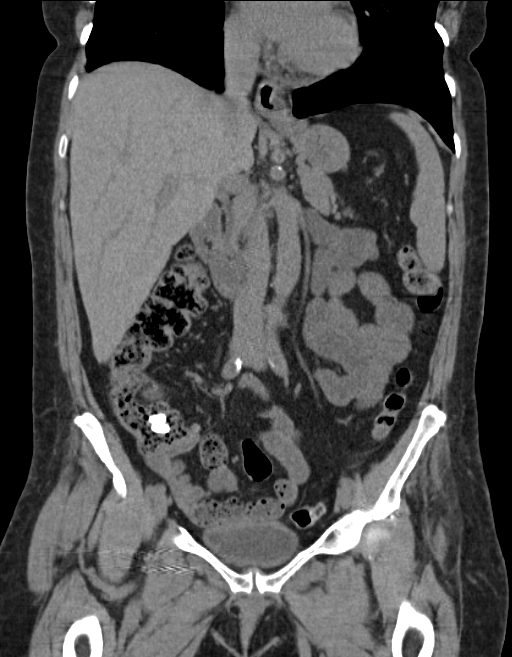
[im 68/123  soft-tissue]
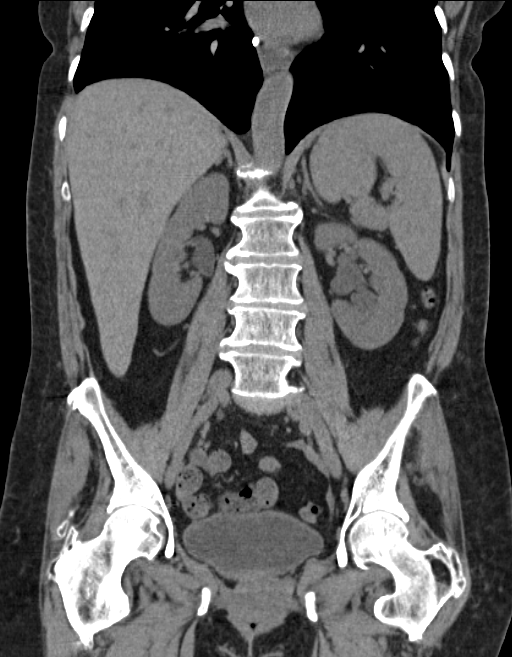

[Series 6: sagittal · sagittal · 0.53mm/px · 1 of 172 slices shown]
[im 58/172  soft-tissue]
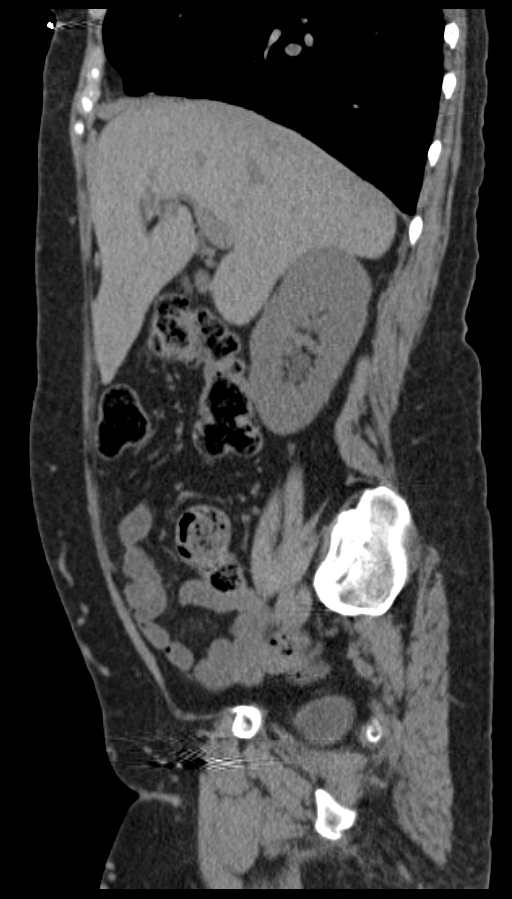

[10 of 46 positions shown; findings below may reference images not displayed]

FINDINGS: Lower chest and abdominal wall:  Small fatty umbilical hernia.

Varicosities over the lower pelvis with treatment changes in the
right groin.

Coronary atherosclerosis seen in the right circulation.

Hepatobiliary: No focal liver abnormality.No evidence of biliary
obstruction or stone.

Pancreas: Unremarkable.

Spleen: Unremarkable.

Adrenals/Urinary Tract: Negative adrenals. Mild bilateral
hydronephrosis with rapid tapering to normal size ureters. No
obstructive process is seen. A punctate stone is present near the
upper urethra

Reproductive:No pathologic findings.

Stomach/Bowel: No obstruction. Mild colonic diverticulosis. Negative
appendix

Vascular/Lymphatic: Atherosclerosis. No acute vascular finding. No
mass or adenopathy.

Peritoneal: No ascites or pneumoperitoneum.

Musculoskeletal: Spondylosis with particularly bulky spurring at the
lower thoracic levels. Lower lumbar facet arthropathy with grade 1
anterolisthesis at L4-5. Sclerosis and ankylosis of the sacroiliac
joints appears degenerative given the degree of spurring.
IMPRESSION: 1. 2 mm calculus at the lower bladder, potentially recently passed
given the history.
2. Mild bilateral hydronephrosis without obstructive process.
Question mild congenital UPJ obstruction.

## 2018-05-08 ENCOUNTER — Other Ambulatory Visit: Payer: Self-pay | Admitting: Family Medicine

## 2018-05-08 DIAGNOSIS — Z13820 Encounter for screening for osteoporosis: Secondary | ICD-10-CM

## 2018-05-08 DIAGNOSIS — Z1231 Encounter for screening mammogram for malignant neoplasm of breast: Secondary | ICD-10-CM

## 2018-10-14 ENCOUNTER — Other Ambulatory Visit: Payer: Self-pay | Admitting: Family Medicine

## 2018-10-14 ENCOUNTER — Encounter (INDEPENDENT_AMBULATORY_CARE_PROVIDER_SITE_OTHER): Payer: Self-pay

## 2018-10-14 ENCOUNTER — Ambulatory Visit
Admission: RE | Admit: 2018-10-14 | Discharge: 2018-10-14 | Disposition: A | Discharge status: Home / Self Care | Payer: Medicare Other | Source: Ambulatory Visit | Attending: Family Medicine | Admitting: Family Medicine

## 2018-10-14 DIAGNOSIS — R600 Localized edema: Secondary | ICD-10-CM | POA: Insufficient documentation

## 2019-06-24 ENCOUNTER — Other Ambulatory Visit: Payer: Self-pay | Admitting: Family Medicine

## 2019-06-24 DIAGNOSIS — Z1231 Encounter for screening mammogram for malignant neoplasm of breast: Secondary | ICD-10-CM

## 2020-04-12 ENCOUNTER — Other Ambulatory Visit: Payer: Self-pay | Admitting: Family Medicine

## 2020-04-12 DIAGNOSIS — Z1231 Encounter for screening mammogram for malignant neoplasm of breast: Secondary | ICD-10-CM

## 2020-11-03 ENCOUNTER — Other Ambulatory Visit: Payer: Self-pay | Admitting: Family Medicine

## 2020-11-03 DIAGNOSIS — Z13820 Encounter for screening for osteoporosis: Secondary | ICD-10-CM

## 2022-04-17 ENCOUNTER — Other Ambulatory Visit: Payer: Self-pay | Admitting: Family Medicine

## 2022-04-17 DIAGNOSIS — Z1231 Encounter for screening mammogram for malignant neoplasm of breast: Secondary | ICD-10-CM

## 2022-05-11 ENCOUNTER — Ambulatory Visit
Admission: RE | Admit: 2022-05-11 | Discharge: 2022-05-11 | Disposition: A | Discharge status: Home / Self Care | Payer: Medicare Other | Source: Ambulatory Visit | Attending: Family Medicine | Admitting: Family Medicine

## 2022-05-11 DIAGNOSIS — Z1231 Encounter for screening mammogram for malignant neoplasm of breast: Secondary | ICD-10-CM | POA: Insufficient documentation

## 2022-05-22 ENCOUNTER — Inpatient Hospital Stay
Admission: RE | Admit: 2022-05-22 | Discharge: 2022-05-22 | Disposition: A | Discharge status: Home / Self Care | Payer: Self-pay | Source: Ambulatory Visit | Attending: *Deleted | Admitting: *Deleted

## 2022-05-22 ENCOUNTER — Other Ambulatory Visit: Payer: Self-pay | Admitting: *Deleted

## 2022-05-22 DIAGNOSIS — Z1231 Encounter for screening mammogram for malignant neoplasm of breast: Secondary | ICD-10-CM

## 2022-11-19 ENCOUNTER — Telehealth: Payer: Self-pay

## 2022-11-19 ENCOUNTER — Other Ambulatory Visit: Payer: Self-pay

## 2022-11-19 DIAGNOSIS — Z1211 Encounter for screening for malignant neoplasm of colon: Secondary | ICD-10-CM

## 2022-11-19 MED ORDER — GOLYTELY 236 G PO SOLR: 4000.0000 mL | Freq: Once | ORAL | 0 refills | Status: AC

## 2022-11-19 NOTE — Telephone Encounter (Signed)
Call made with the assistance of [employer] [place]  [facility] Pre-Procedure Review  Request Date: 12/10/22 Requesting Physician: Dr. Vicente Males  PATIENT REVIEW QUESTIONS: The patient responded to the following health history questions as indicated:    1. Are you having any GI issues? no 2. Do you have a personal history of Polyps? no 3. Do you have a family history of Colon Cancer or Polyps? unsure 4. Diabetes Mellitus? no 5. Joint replacements in the past 12 months?no 6. Major health problems in the past 3 months?no 7. Any artificial heart valves, MVP, or defibrillator?no    MEDICATIONS & ALLERGIES:    Patient reports the following regarding taking any anticoagulation/antiplatelet therapy:   Plavix, Coumadin, Eliquis, Xarelto, Lovenox, Pradaxa, Brilinta, or Effient? no Aspirin? no  Patient confirms/reports the following medications:  No current outpatient medications on file.   No current facility-administered medications for this visit.    Patient confirms/reports the following allergies:  No Known Allergies  No orders of the defined types were placed in this encounter.   AUTHORIZATION INFORMATION Primary Insurance: 1D#: Group #:  Secondary Insurance: 1D#: Group #:  SCHEDULE INFORMATION: Date: 12/10/22 Time: Location: [facility]

## 2022-12-05 ENCOUNTER — Other Ambulatory Visit: Payer: Self-pay | Admitting: Family Medicine

## 2022-12-05 DIAGNOSIS — R150 Incomplete defecation: Secondary | ICD-10-CM

## 2022-12-07 ENCOUNTER — Encounter: Payer: Self-pay | Admitting: Gastroenterology

## 2022-12-10 ENCOUNTER — Telehealth: Payer: Self-pay | Admitting: *Deleted

## 2022-12-10 ENCOUNTER — Ambulatory Visit: Admission: RE | Admit: 2022-12-10 | Payer: Medicare Other | Source: Home / Self Care | Admitting: Gastroenterology

## 2022-12-10 ENCOUNTER — Encounter: Admission: RE | Payer: Self-pay | Source: Home / Self Care

## 2022-12-10 ENCOUNTER — Other Ambulatory Visit: Payer: Self-pay | Admitting: *Deleted

## 2022-12-10 DIAGNOSIS — Z1211 Encounter for screening for malignant neoplasm of colon: Secondary | ICD-10-CM

## 2022-12-10 HISTORY — DX: Personal history of urinary calculi: Z87.442

## 2022-12-10 SURGERY — COLONOSCOPY WITH PROPOFOL
Anesthesia: General

## 2022-12-10 MED ORDER — PEG 3350-KCL-NABCB-NACL-NASULF 236 G PO SOLR: 4000.0000 mL | Freq: Once | ORAL | 0 refills | Status: AC

## 2022-12-10 NOTE — Telephone Encounter (Signed)
Patient's daughter called and stated that we needed to reschedule the colonoscopy.  After speaking to patient's daughter and looking up the information. I have been trying to call this patient's daughter back because she had left voicemail a couple weeks ago to reschedule.

## 2023-01-07 ENCOUNTER — Ambulatory Visit
Admission: RE | Admit: 2023-01-07 | Discharge: 2023-01-07 | Disposition: A | Discharge status: Home / Self Care | Payer: Medicare Other | Source: Ambulatory Visit | Attending: Family Medicine | Admitting: Family Medicine

## 2023-01-07 DIAGNOSIS — R150 Incomplete defecation: Secondary | ICD-10-CM | POA: Diagnosis present

## 2023-01-07 DIAGNOSIS — R159 Full incontinence of feces: Secondary | ICD-10-CM | POA: Insufficient documentation

## 2023-01-17 ENCOUNTER — Encounter: Payer: Self-pay | Admitting: Gastroenterology

## 2023-01-18 ENCOUNTER — Ambulatory Visit: Payer: Medicare Other | Admitting: Anesthesiology

## 2023-01-18 ENCOUNTER — Ambulatory Visit
Admission: RE | Admit: 2023-01-18 | Discharge: 2023-01-18 | Disposition: A | Discharge status: Home / Self Care | Payer: Medicare Other | Attending: Gastroenterology | Admitting: Gastroenterology

## 2023-01-18 ENCOUNTER — Encounter
Admission: RE | Disposition: A | Discharge status: Home / Self Care | Payer: Self-pay | Source: Home / Self Care | Attending: Gastroenterology

## 2023-01-18 ENCOUNTER — Encounter: Payer: Self-pay | Admitting: Gastroenterology

## 2023-01-18 DIAGNOSIS — K219 Gastro-esophageal reflux disease without esophagitis: Secondary | ICD-10-CM | POA: Insufficient documentation

## 2023-01-18 DIAGNOSIS — Z1211 Encounter for screening for malignant neoplasm of colon: Secondary | ICD-10-CM

## 2023-01-18 DIAGNOSIS — D128 Benign neoplasm of rectum: Secondary | ICD-10-CM | POA: Diagnosis not present

## 2023-01-18 DIAGNOSIS — D122 Benign neoplasm of ascending colon: Secondary | ICD-10-CM | POA: Diagnosis not present

## 2023-01-18 DIAGNOSIS — D126 Benign neoplasm of colon, unspecified: Secondary | ICD-10-CM

## 2023-01-18 DIAGNOSIS — I10 Essential (primary) hypertension: Secondary | ICD-10-CM | POA: Diagnosis not present

## 2023-01-18 HISTORY — PX: COLONOSCOPY WITH PROPOFOL: SHX5780

## 2023-01-18 SURGERY — COLONOSCOPY WITH PROPOFOL
Anesthesia: General

## 2023-01-18 MED ORDER — PROPOFOL 10 MG/ML IV BOLUS
INTRAVENOUS | Status: AC
  Filled 2023-01-18: qty 20

## 2023-01-18 MED ORDER — PROPOFOL 500 MG/50ML IV EMUL
INTRAVENOUS | Status: DC | PRN
  Administered 2023-01-18: 50 mg via INTRAVENOUS
  Administered 2023-01-18: 150 ug/kg/min via INTRAVENOUS
  Administered 2023-01-18 (×2): 50 mg via INTRAVENOUS

## 2023-01-18 MED ORDER — ONDANSETRON HCL 4 MG/2ML IJ SOLN
INTRAMUSCULAR | Status: DC | PRN
  Administered 2023-01-18: 4 mg via INTRAVENOUS

## 2023-01-18 MED ORDER — SODIUM CHLORIDE 0.9 % IV SOLN
INTRAVENOUS | Status: DC
  Administered 2023-01-18: 1000 mL via INTRAVENOUS

## 2023-01-18 MED ORDER — SODIUM CHLORIDE 0.9 % IV SOLN: INTRAVENOUS | Status: DC | PRN

## 2023-01-18 NOTE — Anesthesia Postprocedure Evaluation (Signed)
Anesthesia Post Note  Patient: Krystal Lewis  Procedure(s) Performed: COLONOSCOPY WITH PROPOFOL  Patient location during evaluation: PACU Anesthesia Type: General Level of consciousness: awake and awake and alert Pain management: satisfactory to patient Vital Signs Assessment: post-procedure vital signs reviewed and stable Respiratory status: spontaneous breathing and nonlabored ventilation Cardiovascular status: stable Anesthetic complications: no   No notable events documented.   Last Vitals:  Vitals:   01/18/23 1006 01/18/23 1016  BP: 133/60 139/67  Pulse:    Resp:    Temp:    SpO2:      Last Pain:  Vitals:   01/18/23 1016  TempSrc:   PainSc: 0-No pain                 VAN STAVEREN,Lianne Carreto

## 2023-01-18 NOTE — Transfer of Care (Signed)
Immediate Anesthesia Transfer of Care Note  Patient: Krystal Lewis  Procedure(s) Performed: COLONOSCOPY WITH PROPOFOL  Patient Location: PACU  Anesthesia Type:MAC  Level of Consciousness: drowsy  Airway & Oxygen Therapy: Patient Spontanous Breathing  Post-op Assessment: Report given to RN  Post vital signs: Reviewed  Last Vitals:  Vitals Value Taken Time  BP 120/66 01/18/23 0957  Temp 53F   Pulse 74 01/18/23 0959  Resp 16 01/18/23 0959  SpO2 100 % 01/18/23 0959  Vitals shown include unvalidated device data.  Last Pain:  Vitals:   01/18/23 0908  TempSrc: Temporal  PainSc: 0-No pain         Complications: No notable events documented.

## 2023-01-18 NOTE — Anesthesia Preprocedure Evaluation (Signed)
Anesthesia Evaluation  Patient identified by MRN, date of birth, ID band Patient awake    Reviewed: Allergy & Precautions, NPO status , Patient's Chart, lab work & pertinent test results  Airway Mallampati: III  TM Distance: >3 FB Neck ROM: full    Dental  (+) Upper Dentures   Pulmonary neg pulmonary ROS   Pulmonary exam normal breath sounds clear to auscultation       Cardiovascular Exercise Tolerance: Good hypertension, Pt. on medications negative cardio ROS Normal cardiovascular exam Rhythm:Regular Rate:Normal     Neuro/Psych negative neurological ROS  negative psych ROS   GI/Hepatic negative GI ROS, Neg liver ROS,GERD  Medicated,,  Endo/Other  negative endocrine ROS    Renal/GU negative Renal ROS  negative genitourinary   Musculoskeletal   Abdominal Normal abdominal exam  (+)   Peds negative pediatric ROS (+)  Hematology negative hematology ROS (+)   Anesthesia Other Findings Past Medical History: No date: Acid reflux  Past Surgical History: No date: CYST EXCISION     Comment:  from UT x 2     Reproductive/Obstetrics negative OB ROS                             Anesthesia Physical Anesthesia Plan  ASA: 2  Anesthesia Plan: General   Post-op Pain Management:    Induction: Intravenous  PONV Risk Score and Plan: Propofol infusion and TIVA  Airway Management Planned: Natural Airway  Additional Equipment:   Intra-op Plan:   Post-operative Plan:   Informed Consent: I have reviewed the patients History and Physical, chart, labs and discussed the procedure including the risks, benefits and alternatives for the proposed anesthesia with the patient or authorized representative who has indicated his/her understanding and acceptance.     Dental Advisory Given  Plan Discussed with: CRNA and Surgeon  Anesthesia Plan Comments:        Anesthesia Quick Evaluation

## 2023-01-18 NOTE — Op Note (Signed)
[facility] Patient Name: Krystal Lewis Procedure Date: 01/18/2023 9:34 AM MRN: [id number] Account #: [id number] Date of Birth: [birth year] Admit Type: Outpatient Age: 72 Room: [facility] ENDO ROOM 4 Gender: Female Note Status: Finalized Instrument Name: Prentice Docker [id number] Procedure:             Colonoscopy Indications:           Screening for colorectal malignant neoplasm Providers:             Wyline Mood MD, MD Referring MD:          [facility] Medicines:             Monitored Anesthesia Care Complications:         No immediate complications. Procedure:             Pre-Anesthesia Assessment:                        - Prior to the procedure, a History and Physical was                         performed, and patient medications, allergies and                         sensitivities were reviewed. The patient's tolerance                         of previous anesthesia was reviewed.                        - The risks and benefits of the procedure and the                         sedation options and risks were discussed with the                         patient. All questions were answered and informed                         consent was obtained.                        - ASA Grade Assessment: II - A patient with mild                         systemic disease.                        After obtaining informed consent, the colonoscope was                         passed under direct vision. Throughout the procedure,                         the patient's blood pressure, pulse, and oxygen                         saturations were monitored continuously. The                         Colonoscope was introduced through  the anus and                         advanced to the the cecum, identified by the                         appendiceal orifice. The colonoscopy was performed                         with ease. The patient tolerated the procedure well.                          The quality of the bowel preparation was good. The                         ileocecal valve, appendiceal orifice, and rectum were                         photographed. Findings:      The perianal and digital rectal examinations were normal.      Two sessile polyps were found in the rectum and ascending colon. The       polyps were 4 to 5 mm in size. These polyps were removed with a cold       snare. Resection and retrieval were complete.      The exam was otherwise without abnormality on direct and retroflexion       views.      A single small localized angioectasia without bleeding was found in the       cecum. Impression:            - Two 4 to 5 mm polyps in the rectum and in the                         ascending colon, removed with a cold snare. Resected                         and retrieved.                        - The examination was otherwise normal on direct and                         retroflexion views. Recommendation:        - Discharge patient to home (with escort).                        - Resume previous diet.                        - Continue present medications.                        - Await pathology results.                        - Repeat colonoscopy is not recommended due to current                         age (69 years or older) for surveillance based on  pathology results. Procedure Code(s):     --- Professional ---                        [phone], Colonoscopy, flexible; with removal of                         tumor(s), polyp(s), or other lesion(s) by snare                         technique Diagnosis Code(s):     --- Professional ---                        Z12.11, Encounter for screening for malignant neoplasm                         of colon                        D12.8, Benign neoplasm of rectum                        D12.2, Benign neoplasm of ascending colon CPT copyright 2022 American Medical Association. All rights  reserved. The codes documented in this report are preliminary and upon coder review may  be revised to meet current compliance requirements. Wyline Mood, MD Wyline Mood MD, MD 01/18/2023 9:55:34 AM This report has been signed electronically. Number of Addenda: 0 Note Initiated On: 01/18/2023 9:34 AM Scope Withdrawal Time: 0 hours 7 minutes 24 seconds  Total Procedure Duration: 0 hours 13 minutes 34 seconds  Estimated Blood Loss:  Estimated blood loss: none.      [facility]

## 2023-01-18 NOTE — H&P (Signed)
     Wyline Mood, MD [address], [address], [place], [place], [address] [address], [place], [place], [address] Phone: [phone]  Fax: [phone]  Primary Care Physician:  [facility]   Pre-Procedure History & Physical: HPI:  Krystal Lewis is a 72 y.o. female is here for an colonoscopy.   Past Medical History:  Diagnosis Date   Acid reflux    History of kidney stones     Past Surgical History:  Procedure Laterality Date   CYST EXCISION     from [place] x 2    Prior to Admission medications   Medication Sig Start Date End Date Taking? Authorizing Provider  NIFEdipine (PROCARDIA-XL/NIFEDICAL-XL) 30 MG 24 hr tablet Take 30 mg by mouth daily.    [provider]  Omega-3 Fatty Acids (FISH OIL) 1000 MG CAPS Take by mouth. 11/14/22   [provider]    Allergies as of 12/10/2022   (No Known Allergies)    Family History  Problem Relation Age of Onset   Leukemia Mother    Diabetes Mother    Prostate cancer Father    Leukemia Maternal Aunt    Leukemia Maternal Grandmother    Asthma Neg Hx    Ovarian cancer Neg Hx    Colon cancer Neg Hx     Social History   Socioeconomic History   Marital status: Divorced    Spouse name: Not on file   Number of children: Not on file   Years of education: Not on file   Highest education level: Not on file  Occupational History   Not on file  Tobacco Use   Smoking status: Never   Smokeless tobacco: Not on file  Vaping Use   Vaping Use: Never used  Substance and Sexual Activity   Alcohol use: No   Drug use: No   Sexual activity: Not Currently    Birth control/protection: Post-menopausal  Other Topics Concern   Not on file  Social History Narrative   Not on file   Social Determinants of Health   Financial Resource Strain: Not on file  Food Insecurity: Not on file  Transportation Needs: Not on file  Physical Activity: Not on file  Stress: Not on file  Social Connections:  Not on file  Intimate Partner Violence: Not on file    Review of Systems: See HPI, otherwise negative ROS  Physical Exam: There were no vitals taken for this visit. General:   Alert,  pleasant and cooperative in NAD Head:  Normocephalic and atraumatic. Neck:  Supple; no masses or thyromegaly. Lungs:  Clear throughout to auscultation, normal respiratory effort.    Heart:  +S1, +S2, Regular rate and rhythm, No edema. Abdomen:  Soft, nontender and nondistended. Normal bowel sounds, without guarding, and without rebound.   Neurologic:  Alert and  oriented x4;  grossly normal neurologically.  Impression/Plan: Krystal Lewis is here for an colonoscopy to be performed for Screening colonoscopy average risk   Risks, benefits, limitations, and alternatives regarding  colonoscopy have been reviewed with the patient.  Questions have been answered.  All parties agreeable.   Wyline Mood, MD  01/18/2023, 8:53 AM

## 2023-01-21 ENCOUNTER — Encounter: Payer: Self-pay | Admitting: Gastroenterology

## 2023-01-24 LAB — SURGICAL PATHOLOGY

## 2023-01-28 ENCOUNTER — Encounter: Payer: Self-pay | Admitting: Gastroenterology

## 2023-03-25 NOTE — Progress Notes (Signed)
Referring Physician:  [facility] [address] [place],  [place] [address]  Primary Physician:  [facility]  History of Present Illness: 04/01/2023 Ms. Krystal Lewis is here today with a chief complaint of left thigh pain numbness and tingling.  She gets some but relatively little back pain as well.  She states that a majority of her symptoms are in the left anterior lateral thigh.  She does not have a tender point, but notices that she does get tingling sensations in her lateral thigh.  This does not appear to be position dependent.  It feels to be there at all times.  She has not noticed any improvements.  She does state that while sleeping sometimes when she wakes up she will have significant left lower extremity numbness past the knee.  She does not get any signs or symptoms of claudication.  She does not have any significant back pain.  She does not have any bowel or bladder issues.  She has not previously had physical therapy or any epidural steroid injections.  She has not tried any previous medications for this. Left thigh pain  I have utilized the care everywhere function in epic to review the outside records available from external health systems.  Review of Systems:  A 10 point review of systems is negative, except for the pertinent positives and negatives detailed in the HPI.  Past Medical History: Past Medical History:  Diagnosis Date   Acid reflux     Past Surgical History: Past Surgical History:  Procedure Laterality Date   COLONOSCOPY WITH PROPOFOL N/A 01/18/2023   Procedure: COLONOSCOPY WITH PROPOFOL;  Surgeon: Wyline Mood, MD;  Location: [facility] ENDOSCOPY;  Service: Gastroenterology;  Laterality: N/A;  SPANISH INTERPRETER   CYST EXCISION     from [facility] x 2    Allergies: Allergies as of 04/01/2023   (No Known Allergies)    Medications:  Current Outpatient Medications:    isosorbide mononitrate (IMDUR) 30 MG 24 hr  tablet, Take 30 mg by mouth daily., Disp: , Rfl:    Omega-3 Fatty Acids (FISH OIL) 1000 MG CAPS, Take by mouth., Disp: , Rfl:   Social History: Social History   Tobacco Use   Smoking status: Never  Vaping Use   Vaping status: Never Used  Substance Use Topics   Alcohol use: No   Drug use: No    Family Medical History: Family History  Problem Relation Age of Onset   Leukemia Mother    Diabetes Mother    Prostate cancer Father    Leukemia Maternal Aunt    Leukemia Maternal Grandmother    Asthma Neg Hx    Ovarian cancer Neg Hx    Colon cancer Neg Hx     Physical Examination: Vitals:   04/01/23 1420  BP: (!) 150/68    General: Patient is in no apparent distress. Attention to examination is appropriate.  Neck:   Supple.  Full range of motion.  Respiratory: Patient is breathing without any difficulty.   NEUROLOGICAL:     Awake, alert, oriented to person, place, and time.  Speech is clear and fluent.  We utilized the service of an interpreter for Spanish language conversation.  Cranial Nerves: Pupils equal round and reactive to light.  Facial tone is symmetric.  Facial sensation is symmetric. Shoulder shrug is symmetric. Tongue protrusion is midline.    Strength:  Side Iliopsoas Quads Hamstring PF DF EHL  R 5 5 5 5  5  5  L 5 5 5 5 5 5    Reflexes are 2+ and symmetric at the biceps, triceps, brachioradialis, patella and achilles.   Hoffman's is absent. Clonus is absent  Bilateral upper and lower extremity sensation is intact to light touch, with the exception of decreased sensation in the left sided lateral femoral cutaneous nerve distribution.  She does not have a palpable Tinel sign at the ASIS.     No evidence of dysmetria noted.  Gait is normal.    Imaging: Narrative & Impression  CLINICAL DATA:  Intermittent paresthesia of left leg. Episodes of fecal incontinence   EXAM: MRI LUMBAR SPINE WITHOUT CONTRAST   TECHNIQUE: Multiplanar, multisequence MR  imaging of the lumbar spine was performed. No intravenous contrast was administered.   COMPARISON:  None Available.   FINDINGS: Segmentation:  Standard.   Alignment: Degenerative anterolisthesis at L4-5 and L5-S1, greater at L4-5 where there is 7 mm of slit   Vertebrae:  Marrow edema at the L4-5 facets, greater on the left.   Conus medullaris and cauda equina: Conus extends to the L1-2 level. Conus and cauda equina appear normal.   Paraspinal and other soft tissues: Negative for perispinal mass or inflammation   Disc levels:   T12- L1: Mild spondylosis   L1-L2: Mild spondylosis and disc height loss with bulge   L2-L3: Mild disc height loss and bulging.  Small facet spurs   L3-L4: Disc narrowing and bulging. Mild facet spurring greater on the right. Asymmetric right subarticular recess narrowing crowding the right L4 nerve root.   L4-L5: Facet degeneration with spurring, anterolisthesis, and ligamentous thickening. The disc is narrowed and circumferentially bulging. Severe spinal stenosis. Moderate right foraminal narrowing with L4 root flattening   L5-S1:Degenerative facet spurring with anterolisthesis. Synovial cyst at the left foramen measuring 6 mm. Left foraminal impingement on sagittal images.   IMPRESSION: 1. 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 Marrow edema at the L4-5 facets. 2. L4-5 severe spinal stenosis and moderate right foraminal impingement. 3. L5-S1 moderate left foraminal impingement with contributing synovial cysts. 4. L3-4 mild right subarticular recess narrowing.     Electronically Signed   By: Tiburcio Pea M.D.   On: 01/14/2023 05:25      I have personally reviewed the images and agree with the above interpretation.  Medical Decision Making/Assessment and Plan: Left leg pain  Ms. Krystal Lewis is a pleasant 72 y.o. female with a primary complaint of left anterior lateral thigh numbness  pain and tingling.  She states that this is her major complaint.  She does not have any provocative or palliative movements.  Has a clear demarcation in her left anterior lateral thigh.  This likely consistent with meralgia paresthetica.  She does however have nighttime symptoms that include leg numbness that radiates down past her knee.  This is less common and not her main complaint.  She has no weakness.  Signs or symptoms of radiculopathy other than this intermittent sensation changes.  Notably she has almost no back pain however.  Given that her spondylosis is mostly at the L5-S1 levels, and her main complaint is more in an L1 or L2 level but more likely to be a peripheral etiology would like to send her for an electrodiagnostic evaluation for more clear localization.  Should this be meralgia paresthetica we will plan on sending her for a LFC and nerve injection.  Should this be a radiculopathy will plan on sending her for injections to the  spine.  She currently has no symptoms of claudication despite her stenosis.  Thank you for involving me in the care of this patient.    Lovenia Kim MD/MSCR Neurosurgery

## 2023-04-01 ENCOUNTER — Ambulatory Visit: Payer: Medicare Other | Admitting: Neurosurgery

## 2023-04-01 ENCOUNTER — Encounter: Payer: Self-pay | Admitting: Neurosurgery

## 2023-04-01 ENCOUNTER — Ambulatory Visit (INDEPENDENT_AMBULATORY_CARE_PROVIDER_SITE_OTHER): Payer: Medicare Other | Admitting: Neurosurgery

## 2023-04-01 VITALS — BP 150/68 | Ht 59.0 in | Wt 139.4 lb

## 2023-04-01 DIAGNOSIS — M47817 Spondylosis without myelopathy or radiculopathy, lumbosacral region: Secondary | ICD-10-CM | POA: Diagnosis not present

## 2023-04-01 DIAGNOSIS — M79605 Pain in left leg: Secondary | ICD-10-CM

## 2024-04-06 ENCOUNTER — Other Ambulatory Visit: Payer: Self-pay | Admitting: Family Medicine

## 2024-04-06 DIAGNOSIS — Z1231 Encounter for screening mammogram for malignant neoplasm of breast: Secondary | ICD-10-CM

## 2024-05-04 ENCOUNTER — Ambulatory Visit
Admission: RE | Admit: 2024-05-04 | Discharge: 2024-05-04 | Disposition: A | Discharge status: Home / Self Care | Source: Ambulatory Visit | Attending: Family Medicine | Admitting: Family Medicine

## 2024-05-04 DIAGNOSIS — Z1231 Encounter for screening mammogram for malignant neoplasm of breast: Secondary | ICD-10-CM | POA: Insufficient documentation
